# Patient Record
Sex: Female | Born: 1970
Health system: Southern US, Community
[De-identification: ages and names within clinical notes are randomized; demographics above are authoritative.]

## PROBLEM LIST (undated history)

## (undated) DIAGNOSIS — F329 Major depressive disorder, single episode, unspecified: Secondary | ICD-10-CM

## (undated) DIAGNOSIS — R112 Nausea with vomiting, unspecified: Secondary | ICD-10-CM

## (undated) DIAGNOSIS — L408 Other psoriasis: Secondary | ICD-10-CM

## (undated) DIAGNOSIS — H9191 Unspecified hearing loss, right ear: Secondary | ICD-10-CM

## (undated) DIAGNOSIS — G40909 Epilepsy, unspecified, not intractable, without status epilepticus: Secondary | ICD-10-CM

## (undated) DIAGNOSIS — K589 Irritable bowel syndrome without diarrhea: Secondary | ICD-10-CM

## (undated) DIAGNOSIS — F419 Anxiety disorder, unspecified: Secondary | ICD-10-CM

## (undated) DIAGNOSIS — I499 Cardiac arrhythmia, unspecified: Secondary | ICD-10-CM

## (undated) DIAGNOSIS — D126 Benign neoplasm of colon, unspecified: Secondary | ICD-10-CM

## (undated) DIAGNOSIS — I493 Ventricular premature depolarization: Secondary | ICD-10-CM

## (undated) DIAGNOSIS — E039 Hypothyroidism, unspecified: Secondary | ICD-10-CM

## (undated) DIAGNOSIS — K760 Fatty (change of) liver, not elsewhere classified: Secondary | ICD-10-CM

## (undated) DIAGNOSIS — F32A Depression, unspecified: Secondary | ICD-10-CM

## (undated) DIAGNOSIS — Z9889 Other specified postprocedural states: Secondary | ICD-10-CM

## (undated) DIAGNOSIS — K602 Anal fissure, unspecified: Secondary | ICD-10-CM

## (undated) DIAGNOSIS — N83209 Unspecified ovarian cyst, unspecified side: Secondary | ICD-10-CM

## (undated) DIAGNOSIS — E282 Polycystic ovarian syndrome: Secondary | ICD-10-CM

## (undated) DIAGNOSIS — I10 Essential (primary) hypertension: Secondary | ICD-10-CM

## (undated) DIAGNOSIS — I491 Atrial premature depolarization: Secondary | ICD-10-CM

## (undated) HISTORY — DX: Fatty (change of) liver, not elsewhere classified: K76.0

## (undated) HISTORY — DX: Anal fissure, unspecified: K60.2

## (undated) HISTORY — PX: WISDOM TOOTH EXTRACTION: SHX21

## (undated) HISTORY — PX: CYSTOSCOPY: SUR368

## (undated) HISTORY — PX: ABDOMINOPLASTY: SUR9

## (undated) HISTORY — DX: Irritable bowel syndrome, unspecified: K58.9

## (undated) HISTORY — DX: Benign neoplasm of colon, unspecified: D12.6

## (undated) HISTORY — PX: COLONOSCOPY: SHX174

## (undated) HISTORY — DX: Ventricular premature depolarization: I49.3

## (undated) HISTORY — DX: Unspecified hearing loss, right ear: H91.91

## (undated) HISTORY — PX: LIPOSUCTION: SHX10

## (undated) HISTORY — DX: Atrial premature depolarization: I49.1

---

## 1998-02-11 ENCOUNTER — Ambulatory Visit (HOSPITAL_COMMUNITY): Admission: RE | Admit: 1998-02-11 | Discharge: 1998-02-11 | Payer: Self-pay | Admitting: Urology

## 1998-02-17 ENCOUNTER — Ambulatory Visit (HOSPITAL_COMMUNITY): Admission: RE | Admit: 1998-02-17 | Discharge: 1998-02-17 | Payer: Self-pay | Admitting: Urology

## 1998-09-28 ENCOUNTER — Other Ambulatory Visit: Admission: RE | Admit: 1998-09-28 | Discharge: 1998-09-28 | Payer: Self-pay | Admitting: Obstetrics and Gynecology

## 2000-05-06 ENCOUNTER — Other Ambulatory Visit: Admission: RE | Admit: 2000-05-06 | Discharge: 2000-05-06 | Payer: Self-pay | Admitting: Obstetrics and Gynecology

## 2001-08-29 ENCOUNTER — Other Ambulatory Visit: Admission: RE | Admit: 2001-08-29 | Discharge: 2001-08-29 | Payer: Self-pay | Admitting: Obstetrics and Gynecology

## 2002-10-23 ENCOUNTER — Other Ambulatory Visit: Admission: RE | Admit: 2002-10-23 | Discharge: 2002-10-23 | Payer: Self-pay | Admitting: Obstetrics and Gynecology

## 2004-02-25 ENCOUNTER — Other Ambulatory Visit: Admission: RE | Admit: 2004-02-25 | Discharge: 2004-02-25 | Payer: Self-pay | Admitting: Obstetrics and Gynecology

## 2004-08-21 ENCOUNTER — Ambulatory Visit: Payer: Self-pay | Admitting: Internal Medicine

## 2004-09-05 ENCOUNTER — Ambulatory Visit: Payer: Self-pay | Admitting: Internal Medicine

## 2005-04-16 ENCOUNTER — Other Ambulatory Visit: Admission: RE | Admit: 2005-04-16 | Discharge: 2005-04-16 | Payer: Self-pay | Admitting: Obstetrics and Gynecology

## 2005-07-01 ENCOUNTER — Emergency Department (HOSPITAL_COMMUNITY): Admission: AD | Admit: 2005-07-01 | Discharge: 2005-07-01 | Payer: Self-pay | Admitting: Emergency Medicine

## 2005-07-19 ENCOUNTER — Emergency Department (HOSPITAL_COMMUNITY): Admission: EM | Admit: 2005-07-19 | Discharge: 2005-07-19 | Payer: Self-pay | Admitting: Emergency Medicine

## 2005-08-28 ENCOUNTER — Emergency Department (HOSPITAL_COMMUNITY): Admission: EM | Admit: 2005-08-28 | Discharge: 2005-08-28 | Payer: Self-pay | Admitting: Emergency Medicine

## 2005-09-20 ENCOUNTER — Ambulatory Visit (HOSPITAL_COMMUNITY): Admission: RE | Admit: 2005-09-20 | Discharge: 2005-09-20 | Payer: Self-pay | Admitting: Internal Medicine

## 2008-04-02 ENCOUNTER — Ambulatory Visit: Payer: Self-pay | Admitting: Internal Medicine

## 2008-04-02 DIAGNOSIS — I491 Atrial premature depolarization: Secondary | ICD-10-CM

## 2008-04-02 DIAGNOSIS — I493 Ventricular premature depolarization: Secondary | ICD-10-CM

## 2008-04-02 HISTORY — DX: Ventricular premature depolarization: I49.3

## 2008-04-02 HISTORY — DX: Atrial premature depolarization: I49.1

## 2008-04-07 ENCOUNTER — Encounter: Payer: Self-pay | Admitting: Internal Medicine

## 2008-04-07 ENCOUNTER — Ambulatory Visit: Payer: Self-pay

## 2010-01-26 ENCOUNTER — Encounter: Payer: Self-pay | Admitting: Internal Medicine

## 2010-02-14 DIAGNOSIS — G40309 Generalized idiopathic epilepsy and epileptic syndromes, not intractable, without status epilepticus: Secondary | ICD-10-CM | POA: Insufficient documentation

## 2010-02-14 DIAGNOSIS — Q169 Congenital malformation of ear causing impairment of hearing, unspecified: Secondary | ICD-10-CM | POA: Insufficient documentation

## 2010-02-14 DIAGNOSIS — E282 Polycystic ovarian syndrome: Secondary | ICD-10-CM | POA: Insufficient documentation

## 2010-02-14 DIAGNOSIS — R011 Cardiac murmur, unspecified: Secondary | ICD-10-CM | POA: Insufficient documentation

## 2010-02-14 DIAGNOSIS — I491 Atrial premature depolarization: Secondary | ICD-10-CM | POA: Insufficient documentation

## 2010-02-14 DIAGNOSIS — I4949 Other premature depolarization: Secondary | ICD-10-CM | POA: Insufficient documentation

## 2010-02-14 DIAGNOSIS — L408 Other psoriasis: Secondary | ICD-10-CM | POA: Insufficient documentation

## 2010-02-15 ENCOUNTER — Ambulatory Visit: Payer: Self-pay | Admitting: Internal Medicine

## 2010-02-15 DIAGNOSIS — R55 Syncope and collapse: Secondary | ICD-10-CM | POA: Insufficient documentation

## 2010-02-17 ENCOUNTER — Encounter: Payer: Self-pay | Admitting: Internal Medicine

## 2010-02-20 ENCOUNTER — Telehealth: Payer: Self-pay | Admitting: Internal Medicine

## 2010-02-28 ENCOUNTER — Ambulatory Visit: Payer: Self-pay | Admitting: Cardiology

## 2010-02-28 ENCOUNTER — Ambulatory Visit (HOSPITAL_COMMUNITY): Admission: RE | Admit: 2010-02-28 | Discharge: 2010-02-28 | Payer: Self-pay | Admitting: Internal Medicine

## 2010-02-28 ENCOUNTER — Ambulatory Visit: Payer: Self-pay

## 2010-02-28 ENCOUNTER — Encounter: Payer: Self-pay | Admitting: Internal Medicine

## 2010-03-22 ENCOUNTER — Telehealth: Payer: Self-pay | Admitting: Internal Medicine

## 2010-07-25 NOTE — Assessment & Plan Note (Signed)
Summary: f2y/syncope/jml   Visit Type:  Follow-up Primary Provider:                                                                                                                                                                        Westly Pam, MD                   History of Present Illness: Pam Graham is a pleasant 40 yo WF with a h/o symptomatic PACs and PVCs who presents today for EP follow-up after a recent syncopal episode.  She reports that on 01/20/10 that she was standing over her husband evaluating his recently injured arm at 7:30 am.  She then developed nausea followed by diaphoresis and presyncope.  She denies associated chest pain, SOB, or palpitations.  She told her husband that she felt as if she might pass out.  She went to sit on her bed and had loss of consiousness for a brief period of time.  She reports that it took her several minutes to regain composure.  Though she has had a prior seizure, she did not have observed seizure activity with the event. She went to [facility] and had a negative ER evaluation.  She reports occasional nausea with diaphoresis but denies subsequent presyncope or syncope.  She denies exertional cardiac symptoms or other complaints.  Current Medications (verified): 1)  Loestrin 24 Fe 1-20 Mg-Mcg Tabs (Norethin Ace-Eth Estrad-Fe) .... Uad 2)  Multivitamins   Tabs (Multiple Vitamin) .... Once Daily 3)  Cetirizine Hcl 10 Mg Tabs (Cetirizine Hcl) .... Once Daily 4)  Clonazepam 0.5 Mg Tabs (Clonazepam) .... 1/2 Tablet Two Times A Day 5)  Zoloft 50 Mg Tabs (Sertraline Hcl) .... Once Daily  Allergies (verified): 1)  ! * Pcn 2)  ! Glucophage 3)  ! * Ivp Dye  Past History:  Past Medical History:  1. Premature atrial contractions and premature ventricular contractions   2. Polycystic ovarian syndrome.   3. Erythroderma psoriasis.   4. Benign heart murmur.   5. Right ear congenital deafness.   6. Grand mall seizure in March 2007, which was attributed to multiple  factors including discontinuing Xanax, medications, and sleep deprivation.   Family History:  Notable for colon cancer.  The patient's maternal uncle  had an MI at age 87.  Her maternal grandfather had an MI in his 57s.   Social History: [place]  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  She denies tobacco, alcohol, or drug use.      Review of Systems       All systems are reviewed and negative except as listed in the HPI.   Vital Signs:  Patient profile:   40 year old female Height:      68 inches Weight:      170 pounds BMI:     25.94 Pulse rate:   76 / minute Pulse (ortho):   82 / minute BP sitting:   128 / 86  (left arm) BP standing:   118 / 80  Vitals Entered By: Laurance Flatten CMA (February 15, 2010 11:20 AM)  Serial Vital Signs/Assessments:  Time      Position  BP       Pulse  Resp  Temp     By           Lying RA  122/82   67                    Jewel Hardy CMA           Sitting   115/83   69                    Jewel Hardy CMA           Standing  118/     82                    Jewel Hardy CMA           Standing  121/80   76                    Jewel Hardy CMA           Standing  117/81   88                    Jewel Hardy CMA   Physical Exam  General:  Well developed, well nourished, in no acute distress. Head:  normocephalic and atraumatic Eyes:  PERRLA/EOM intact; conjunctiva and lids normal. Mouth:  Teeth, gums and palate normal. Oral mucosa normal. Neck:  Neck supple, no JVD. No masses, thyromegaly or abnormal cervical nodes. Lungs:  Clear bilaterally to auscultation and percussion. Heart:  Non-displaced PMI, chest non-tender; regular rate and rhythm, S1, S2 without murmurs, rubs or gallops. Carotid upstroke normal, no bruit. Normal abdominal aortic size, no bruits. Femorals normal pulses, no bruits. Pedals normal pulses. No edema, no varicosities. Abdomen:  Bowel sounds positive; abdomen soft and non-tender  without masses, organomegaly, or hernias noted. No hepatosplenomegaly. Msk:  Back normal, normal gait. Muscle strength and tone normal. Pulses:  pulses normal in all 4 extremities Extremities:  No clubbing or cyanosis. Neurologic:  Alert and oriented x 3. Skin:  Intact without lesions or rashes. Cervical Nodes:  no significant adenopathy Psych:  Normal affect.   Echocardiogram  Procedure date:  04/07/2008  Findings:       SUMMARY -  Overall left ventricular systolic function was normal. Left       ventricular ejection fraction was estimated to be 60 %. There       were no left ventricular regional wall motion abnormalities.    EKG  Procedure date:  02/15/2010  Findings:      sinus rhythm 75 bpm, PR 110 without obvious pre-excitation, otherwise normal ekg  Impression & Recommendations:  Problem # 1:  SYNCOPE (ICD-780.2) The patient presents for further evaluation of a recent episode of syncope.  Her history is classic for vasovagal syncope.  Though I am not certain why this event occured, she is mildly orthostatic by heart rate and may have been  dehydrated.  I think that an arrhythmia or other cardiac cause is unlikely. I will place event monitor and repeat echo (echo 2 years ago is normal).  If this workup is unrevealing and she has no further syncope, then I would not recommend further testing.  I have recommended adequate hydration. In addition, she is aware of DMV's policy of no driving for 6 months following an episode of syncope.  Problem # 2:  PREMATURE VENTRICULAR CONTRACTIONS (ICD-427.69) The patients PACs and PVCs have significantly improved since last visit no further management is required.  Other Orders: Echocardiogram (Echo) Holter Monitor (Holter Monitor)  Patient Instructions: 1)  Your physician has requested that you have an echocardiogram.  Echocardiography is a painless test that uses sound waves to create images of your heart. It provides your doctor  with information about the size and shape of your heart and how well your heart's chambers and valves are working.  This procedure takes approximately one hour. There are no restrictions for this procedure. 2)  Your physician has recommended that you wear a holter monitor.  Holter monitors are medical devices that record the heart's electrical activity. Doctors most often use these monitors to diagnose arrhythmias. Arrhythmias are problems with the speed or rhythm of the heartbeat. The monitor is a small, portable device. You can wear one while you do your normal daily activities. This is usually used to diagnose what is causing palpitations/syncope (passing out). 3)  No driving for 5 more months 4)  Your physician recommends that you schedule a follow-up appointment in: 3 months with Dr Johney Frame

## 2010-07-25 NOTE — Letter (Signed)
Summary: [facility] Office Note   [facility] Office Note   Imported By: Roderic Ovens 03/14/2010 15:37:14  _____________________________________________________________________  External Attachment:    Type:   Image     Comment:   External Document

## 2010-07-25 NOTE — Procedures (Signed)
Summary: Summary Report  Summary Report   Imported By: Erle Crocker 03/17/2010 11:49:24  _____________________________________________________________________  External Attachment:    Type:   Image     Comment:   External Document

## 2010-07-25 NOTE — Progress Notes (Signed)
Summary: Holter results [facility])  Phone Note Outgoing Call   Call placed by: Sherri Rad, RN, BSN,  February 20, 2010 5:35 PM Call placed to: Patient Summary of Call: I left a message for the pt. to call regarding her holter results. Per Dr. Johney Frame- predominantly NSR. He was looking more for tachycardia, but monitor showed the pt had some slow HR's with rates down to the 40's, avg HR was 70. She had occasional PVC's. Per Dr.  Johney Frame- pt to have next week (9/6) as scheduled. She has f/u in november with Dr. Johney Frame. This will be left the same unless any abnormalities show up on the echo.  Initial call taken by: Sherri Rad, RN, BSN,  February 20, 2010 5:37 PM

## 2010-07-25 NOTE — Procedures (Signed)
Summary: Summary Report  Summary Report   Imported By: Erle Crocker 03/22/2010 14:09:10  _____________________________________________________________________  External Attachment:    Type:   Image     Comment:   External Document

## 2010-07-25 NOTE — Progress Notes (Signed)
Summary: want results of 24hr heart monitor  Phone Note Call from Patient Call back at Home Phone [phone]   Caller: Patient Reason for Call: Talk to Nurse, Talk to Doctor, Lab or Test Results Summary of Call: pt wants results of 24hr holter results Initial call taken by: Omer Jack,  March 22, 2010 9:36 AM  Follow-up for Phone Call        lmom for pt to call back Dennis Bast, RN, BSN  March 22, 2010 10:55 AM lmom for pt to call me back Dennis Bast, RN, BSN  March 24, 2010 8:57 AM lmom for pt to call me back for results Dennis Bast, RN, BSN  March 29, 2010 5:29 PM

## 2010-09-26 ENCOUNTER — Telehealth: Payer: Self-pay | Admitting: Internal Medicine

## 2010-09-26 NOTE — Telephone Encounter (Signed)
Echo,12,LOV [facility] @ [phone] 09/26/10/KM

## 2010-11-07 NOTE — Letter (Signed)
April 02, 2008    Guy Sandifer. Henderson Cloud, M.D.  [address]  [place], [place] [address]   RE:  JENINE, KRISHER  MRN:  [id number]  /  DOB:  [birth year]   Dear Dr. Henderson Cloud:   It was my pleasure to see your patient Ameera Tigue in electrophysiologic  consultation today regarding her premature atrial contractions and  premature ventricular contractions.  As you are aware, this is a very  pleasant 40 year old female with a history of polycystic ovary syndrome  and erythroderma of psoriasis who has recently developed symptomatic  premature ventricular contractions.  She reports that over the past 2  months that she has had sensation of my heart is flipping .  Occasionally, these episodes are accompanied by cough.  She denies  associated dizziness, near syncope or syncope, chest discomfort,  shortness of breath, or other symptoms.  She finds that these episodes  are frequently worsened with the stress.  She is also found that  caffeine occasionally appears to make them worse.  She recently had EKGs  obtained on February 17, 2008, during these episodes and was able to  capture a premature ventricular contraction of a right bundle branch,  right inferior axis morphology.  Rare premature atrial contractions were  also observed on rhythm strip.  Upon discussion with you, her Yasmin was  discontinued.  Despite this medication change, she has had ongoing  symptomatic premature ventricular contractions.  She reports several  palpitations approximately 2 years ago, which lasted for about a month  and then terminated on her own.  She denies any prior syncopal episodes.  She is otherwise without complaint today.   PAST MEDICAL HISTORY:  1. Premature atrial contractions and premature ventricular      contractions (as above).  2. Polycystic ovarian syndrome.  3. Erythroderma psoriasis.  4. Benign heart murmur.  5. Right ear congenital deafness.  6. Grand mall seizure in March 2007, which was  attributed to multiple      factors including discontinuing Xanax, medications, and sleep      deprivation.   CURRENT MEDICATIONS:  1. Claritin 10 mg daily.  2. Loestrin plus iron 1 mg/20 mcg daily.   ALLERGIES:  PENICILLIN and IVP DYE cause rash, GLUCOPHAGE causes  itching.   SOCIAL HISTORY:  [place] in [place], [place].  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  She denies tobacco, alcohol, or drug use.   FAMILY HISTORY:  Notable for colon cancer.  The patient's maternal uncle  had an MI at age 2.  Her maternal grandfather had an MI in his 41s.   REVIEW OF SYSTEMS:  All systems are reviewed and negative except as  outlined in the HPI above.   PHYSICAL EXAMINATION:  VITAL SIGNS:  Blood pressure 160/90 (repeated by  me to be 128/96), heart rate 90, respirations 18, and weight 182 pounds.  GENERAL:  The patient is a well-appearing female in no acute distress.  She is alert and oriented x3.  HEENT:  Normocephalic and atraumatic.  Sclerae clear.  Conjunctivae  pink.  Oropharynx clear.  NECK:  Supple.  No JVD, lymphadenopathy, or bruits.  LUNGS:  Clear to auscultation bilaterally.  HEART:  Regular rate and rhythm.  No murmurs, rubs, or gallops.  GI:  Soft, nontender, nondistended.  Positive bowel sounds.  EXTREMITIES:  No clubbing, cyanosis, or edema.  NEUROLOGIC:  Strength and sensation are intact.  SKIN:  No ecchymosis or lacerations.  MUSCULOSKELETAL:  No deformity or atrophy.  PSYCHIATRIC:  Euthymic mood, full affect.   EKG:  The patient's EKG from August 25 reveals normal sinus rhythm with  a single premature ventricular contraction as described above.  The EKG  is otherwise normal   TSH from your office February 27, 2008, is normal.  Electrolytes from  that same time were also normal.   IMPRESSION:  Ms. Judithann Sheen is a very pleasant 40 year old female who  presents with symptomatic premature atrial  contractions and premature  ventricular contractions.  I suspect that these are completely benign.  The patient reports that they have been worsened by stress and possibly  and possibly caffeine use.  I have therefore recommended that she work  on lifestyle modification with a limitation of stress if possible and  also a limitation and also limiting her caffeine.  The patient's blood  pressure was initially elevated upon arrival to our clinic today.  Certainly hypertension cannot only increase the frequency of premature  ventricular contractions, but can also make person more symptomatic when  they occur.   PLAN:  Therapeutic strategies for PACs and PVCs were discussed in detail  with the patient today.  Lifestyle modification and medical therapy with  beta blockers were discussed.  Presently, I think the patient is not  symptomatic enough that we should initiate her on beta blocker therapy  at this time.  I think that she should work on salt restrictions and  regular exercise and weight loss and reduction of stress.  I have asked  her to check her blood pressure regularly and to follow up with your  office should her blood pressure be frequently elevated.  She would  likely benefit from treatment of hypertension if lifestyle modification  alone does not improve her blood pressure.  We will also obtain  transthoracic echocardiogram today to evaluate for any structural heart  disease as an underlying cause for her premature ventricular  contractions.  However, should her echocardiogram being normal, then I  think no further cardiac evaluation would be necessary at this time.   Thank you for the opportunity to participate in the care of [facility].  Please let me know if I can be of assistance in the future.    Sincerely,      Hillis Range, MD  Electronically Signed    JA/MedQ  DD: 04/02/2008  DT: 04/02/2008  Job #: [id number]   CC:    Rollene Rotunda, MD, [facility]

## 2011-09-17 ENCOUNTER — Encounter (HOSPITAL_COMMUNITY): Payer: Self-pay | Admitting: *Deleted

## 2011-09-17 ENCOUNTER — Inpatient Hospital Stay (HOSPITAL_COMMUNITY)
Admission: AD | Admit: 2011-09-17 | Discharge: 2011-09-17 | Disposition: A | Discharge status: Home / Self Care | Payer: BC Managed Care – PPO | Source: Ambulatory Visit | Attending: Obstetrics and Gynecology | Admitting: Obstetrics and Gynecology

## 2011-09-17 DIAGNOSIS — R1115 Cyclical vomiting syndrome unrelated to migraine: Secondary | ICD-10-CM

## 2011-09-17 DIAGNOSIS — R1032 Left lower quadrant pain: Secondary | ICD-10-CM | POA: Insufficient documentation

## 2011-09-17 DIAGNOSIS — R1110 Vomiting, unspecified: Secondary | ICD-10-CM

## 2011-09-17 DIAGNOSIS — R109 Unspecified abdominal pain: Secondary | ICD-10-CM

## 2011-09-17 DIAGNOSIS — R112 Nausea with vomiting, unspecified: Secondary | ICD-10-CM | POA: Insufficient documentation

## 2011-09-17 DIAGNOSIS — N83202 Unspecified ovarian cyst, left side: Secondary | ICD-10-CM

## 2011-09-17 DIAGNOSIS — N83209 Unspecified ovarian cyst, unspecified side: Secondary | ICD-10-CM | POA: Insufficient documentation

## 2011-09-17 HISTORY — DX: Essential (primary) hypertension: I10

## 2011-09-17 HISTORY — DX: Epilepsy, unspecified, not intractable, without status epilepticus: G40.909

## 2011-09-17 HISTORY — DX: Unspecified ovarian cyst, unspecified side: N83.209

## 2011-09-17 HISTORY — DX: Other psoriasis: L40.8

## 2011-09-17 HISTORY — DX: Polycystic ovarian syndrome: E28.2

## 2011-09-17 LAB — CBC
MCH: 31.3 pg (ref 26.0–34.0)
MCHC: 33.9 g/dL (ref 30.0–36.0)
MCV: 92.2 fL (ref 78.0–100.0)
Platelets: 231 10*3/uL (ref 150–400)
RDW: 12.8 % (ref 11.5–15.5)

## 2011-09-17 LAB — COMPREHENSIVE METABOLIC PANEL
ALT: 28 U/L (ref 0–35)
AST: 25 U/L (ref 0–37)
Alkaline Phosphatase: 96 U/L (ref 39–117)
CO2: 22 mEq/L (ref 19–32)
GFR calc Af Amer: 81 mL/min — ABNORMAL LOW (ref >90)
Glucose, Bld: 194 mg/dL — ABNORMAL HIGH (ref 70–99)
Sodium: 133 mEq/L — ABNORMAL LOW (ref 135–145)

## 2011-09-17 MED ORDER — ONDANSETRON HCL 4 MG/2ML IJ SOLN
4.0000 mg | Freq: Once | INTRAMUSCULAR | Status: AC
  Administered 2011-09-17: 4 mg via INTRAVENOUS
  Filled 2011-09-17: qty 2

## 2011-09-17 MED ORDER — DEXTROSE 5 % IN LACTATED RINGERS IV BOLUS
1000.0000 mL | Freq: Once | INTRAVENOUS | Status: AC
  Administered 2011-09-17: 1000 mL via INTRAVENOUS

## 2011-09-17 MED ORDER — ONDANSETRON 8 MG PO TBDP: 8.0000 mg | ORAL_TABLET | Freq: Three times a day (TID) | ORAL | Status: AC | PRN

## 2011-09-17 MED ORDER — KETOROLAC TROMETHAMINE 30 MG/ML IJ SOLN
30.0000 mg | Freq: Once | INTRAMUSCULAR | Status: AC
  Administered 2011-09-17: 30 mg via INTRAVENOUS
  Filled 2011-09-17: qty 1

## 2011-09-17 NOTE — MAU Provider Note (Signed)
History     CSN: [id number]  Arrival date and time: 09/17/11 1531   First Provider Initiated Contact with Patient 09/17/11 1624      Chief Complaint  Patient presents with  . Abdominal Pain   HPI  Pt is not pregnant and was seen by Dr. Henderson Cloud in the office today with LLQ pain associated with hyperemesis.  Pt had an ultrasound in the office that showed a 2.2cm simple cyst and no FF.  Pt is here for analgesia and antiemetics with hydration.  CBC and CMET drawn.  She took 2 hydrocodone this morning aroun 11 am and did not help the pain.  Pt has PCO and has irregular periods.  She is on LoEstrin 24.  Past Medical History  Diagnosis Date  . Ovarian cyst   . PCOS (polycystic ovarian syndrome)   . Erythrodermic psoriasis   . Seizure disorder   . Hypertension     Past Surgical History  Procedure Date  . Cystoscopy   . Liposuction   . Abdominoplasty   . Colonoscopy     Family History  Problem Relation Age of Onset  . Anesthesia problems Neg Hx     History  Substance Use Topics  . Smoking status: Never Smoker   . Smokeless tobacco: Not on file  . Alcohol Use: Yes     socially    Allergies:  Allergies  Allergen Reactions  . Iohexol      Desc: HIVES,ICHINGING,NEEDS PRE.MEDS   . Metformin Itching  . Penicillins Hives and Itching    Prescriptions prior to admission  Medication Sig Dispense Refill  . cetirizine (ZYRTEC) 10 MG tablet Take 10 mg by mouth daily.      Marland Kitchen levETIRAcetam (KEPPRA XR) 500 MG 24 hr tablet Take 1,000 mg by mouth at bedtime.      . Multiple Vitamin (MULITIVITAMIN WITH MINERALS) TABS Take 1 tablet by mouth daily.      . norethindrone-ethinyl estradiol-iron (MICROGESTIN FE,GILDESS FE,LOESTRIN FE) 1.5-30 MG-MCG tablet Take 1 tablet by mouth at bedtime.      . sertraline (ZOLOFT) 100 MG tablet Take 100 mg by mouth daily.        ROS Physical Exam   Blood pressure 139/84, pulse 56, temperature 96.7 F (35.9 C), temperature source Oral, resp.  rate 16, height 5\' 8"  (1.727 m), weight 190 lb 6 oz (86.354 kg).  Physical Exam  Vitals reviewed. Constitutional: She is oriented to person, place, and time. She appears well-developed and well-nourished.       Pale and uncomfortable appearing  HENT:  Head: Normocephalic.  Eyes: Pupils are equal, round, and reactive to light.  Neck: Normal range of motion. Neck supple.  Cardiovascular: Normal rate.   Respiratory: Effort normal.  GI: Soft.  Musculoskeletal: Normal range of motion.  Neurological: She is alert and oriented to person, place, and time.  Skin: Skin is warm and dry. There is pallor.  Psychiatric: She has a normal mood and affect.    MAU Course  Procedures Results for orders placed during the hospital encounter of 09/17/11 (from the past 24 hour(s))  CBC     Status: Abnormal   Collection Time   09/17/11  4:15 PM      Component Value Range   WBC 14.4 (*) 4.0 - 10.5 (K/uL)   RBC 4.51  3.87 - 5.11 (MIL/uL)   Hemoglobin 14.1  12.0 - 15.0 (g/dL)   HCT 40.9  81.1 - 91.4 (%)   MCV 92.2  78.0 -  100.0 (fL)   MCH 31.3  26.0 - 34.0 (pg)   MCHC 33.9  30.0 - 36.0 (g/dL)   RDW 69.6  29.5 - 28.4 (%)   Platelets 231  150 - 400 (K/uL)  COMPREHENSIVE METABOLIC PANEL     Status: Abnormal   Collection Time   09/17/11  4:15 PM      Component Value Range   Sodium 133 (*) 135 - 145 (mEq/L)   Potassium 4.2  3.5 - 5.1 (mEq/L)   Chloride 97  96 - 112 (mEq/L)   CO2 22  19 - 32 (mEq/L)   Glucose, Bld 194 (*) 70 - 99 (mg/dL)   BUN 13  6 - 23 (mg/dL)   Creatinine, Ser 1.32  0.50 - 1.10 (mg/dL)   Calcium 9.6  8.4 - 44.0 (mg/dL)   Total Protein 7.0  6.0 - 8.3 (g/dL)   Albumin 3.7  3.5 - 5.2 (g/dL)   AST 25  0 - 37 (U/L)   ALT 28  0 - 35 (U/L)   Alkaline Phosphatase 96  39 - 117 (U/L)   Total Bilirubin 0.4  0.3 - 1.2 (mg/dL)   GFR calc non Af Amer 70 (*) >90 (mL/min)   GFR calc Af Amer 81 (*) >90 (mL/min)  pt's pain and nausea relieved with IV hydration with LR and Zofran IV and Toradol  IV  Assessment and Plan  Ovarian cyst- continue BCP Elevated serum glucose PCO- pt will f/u with endocrinologist at [facility] Abdominal pain with nausea and vomiting- prescription for Zofran given and pt has prescription for Percocet and pt will alternate with Ibuprofen Pt to f/u with Dr. Henderson Cloud if pain persists or worsens Pam Graham 09/17/2011, 5:09 PM

## 2011-09-17 NOTE — MAU Note (Signed)
Pt states was sent from Dr. Huel Coventry office for iv fluids, bloodwork, has llq pain, denies vag d/c changes or bleeding. UPT negative per Dr. Henderson Cloud.

## 2011-09-17 NOTE — MAU Note (Signed)
Pt sent over from office for pain medication and iv fluids.  Had an ultrasound in office that showed a cyst on left ovary.  States pain started Wednesday, but got bad today.

## 2011-12-19 ENCOUNTER — Other Ambulatory Visit (HOSPITAL_COMMUNITY): Payer: Self-pay | Admitting: Physician Assistant

## 2011-12-19 ENCOUNTER — Ambulatory Visit (HOSPITAL_COMMUNITY)
Admission: RE | Admit: 2011-12-19 | Discharge: 2011-12-19 | Disposition: A | Discharge status: Home / Self Care | Payer: BC Managed Care – PPO | Source: Ambulatory Visit | Attending: Physician Assistant | Admitting: Physician Assistant

## 2011-12-19 DIAGNOSIS — R220 Localized swelling, mass and lump, head: Secondary | ICD-10-CM | POA: Insufficient documentation

## 2011-12-19 DIAGNOSIS — L049 Acute lymphadenitis, unspecified: Secondary | ICD-10-CM

## 2011-12-19 DIAGNOSIS — R221 Localized swelling, mass and lump, neck: Secondary | ICD-10-CM | POA: Insufficient documentation

## 2011-12-20 ENCOUNTER — Other Ambulatory Visit (HOSPITAL_COMMUNITY): Payer: Self-pay | Admitting: Physician Assistant

## 2011-12-20 DIAGNOSIS — R220 Localized swelling, mass and lump, head: Secondary | ICD-10-CM

## 2011-12-21 ENCOUNTER — Other Ambulatory Visit (HOSPITAL_COMMUNITY): Payer: Self-pay | Admitting: Physician Assistant

## 2011-12-21 DIAGNOSIS — R945 Abnormal results of liver function studies: Secondary | ICD-10-CM

## 2011-12-24 ENCOUNTER — Other Ambulatory Visit (HOSPITAL_COMMUNITY): Payer: BC Managed Care – PPO

## 2011-12-24 ENCOUNTER — Ambulatory Visit (HOSPITAL_COMMUNITY)
Admission: RE | Admit: 2011-12-24 | Discharge: 2011-12-24 | Disposition: A | Discharge status: Home / Self Care | Payer: BC Managed Care – PPO | Source: Ambulatory Visit | Attending: Physician Assistant | Admitting: Physician Assistant

## 2011-12-24 DIAGNOSIS — R221 Localized swelling, mass and lump, neck: Secondary | ICD-10-CM

## 2011-12-24 DIAGNOSIS — R220 Localized swelling, mass and lump, head: Secondary | ICD-10-CM | POA: Insufficient documentation

## 2011-12-24 DIAGNOSIS — R599 Enlarged lymph nodes, unspecified: Secondary | ICD-10-CM | POA: Insufficient documentation

## 2011-12-24 MED ORDER — GADOBENATE DIMEGLUMINE 529 MG/ML IV SOLN
18.0000 mL | Freq: Once | INTRAVENOUS | Status: AC | PRN
  Administered 2011-12-24: 18 mL via INTRAVENOUS

## 2011-12-26 ENCOUNTER — Ambulatory Visit (HOSPITAL_COMMUNITY)
Admission: RE | Admit: 2011-12-26 | Discharge: 2011-12-26 | Disposition: A | Discharge status: Home / Self Care | Payer: BC Managed Care – PPO | Source: Ambulatory Visit | Attending: Physician Assistant | Admitting: Physician Assistant

## 2011-12-26 ENCOUNTER — Other Ambulatory Visit (HOSPITAL_COMMUNITY): Payer: BC Managed Care – PPO

## 2011-12-26 DIAGNOSIS — K838 Other specified diseases of biliary tract: Secondary | ICD-10-CM | POA: Insufficient documentation

## 2011-12-26 DIAGNOSIS — K760 Fatty (change of) liver, not elsewhere classified: Secondary | ICD-10-CM

## 2011-12-26 DIAGNOSIS — R945 Abnormal results of liver function studies: Secondary | ICD-10-CM

## 2011-12-26 HISTORY — DX: Fatty (change of) liver, not elsewhere classified: K76.0

## 2012-10-28 ENCOUNTER — Encounter: Payer: Self-pay | Admitting: *Deleted

## 2012-12-16 ENCOUNTER — Encounter: Payer: Self-pay | Admitting: Internal Medicine

## 2012-12-16 ENCOUNTER — Ambulatory Visit (INDEPENDENT_AMBULATORY_CARE_PROVIDER_SITE_OTHER): Payer: BC Managed Care – PPO | Admitting: Internal Medicine

## 2012-12-16 VITALS — BP 110/80 | HR 72 | Ht 68.0 in | Wt 197.5 lb

## 2012-12-16 DIAGNOSIS — K625 Hemorrhage of anus and rectum: Secondary | ICD-10-CM

## 2012-12-16 MED ORDER — HYDROCORTISONE ACETATE 25 MG RE SUPP: 25.0000 mg | Freq: Every day | RECTAL | Status: DC

## 2012-12-16 NOTE — Patient Instructions (Addendum)
We have sent the following medications to your pharmacy for you to pick up at your convenience: [facility]   Today you have been given a handout on fiber to read and follow. Ben Mann,PA

## 2012-12-16 NOTE — Progress Notes (Signed)
Pam Graham 1970/09/14 MRN [id number]  History of Present Illness:  This is a 42 year old white female with painless low-volume hematochezia occurring intermittently. She is also complaining of incomplete cleaning after having a bowel movement. There is no leakage. Her bowel habits are regular, having 1-2 bowel movements a day. She was evaluated for hematochezia in 2006 with a colonoscopy and had no abnormalities. There is a family history of colon cancer in a paternal grandmother and colon polyps in her father and father's sister. There is a history of anal fissure which was treated in the past.   Past Medical History  Diagnosis Date  . Ovarian cyst   . PCOS (polycystic ovarian syndrome)   . Erythrodermic psoriasis   . Seizure disorder   . Hypertension   . Fatty liver 12/26/11  . PAC (premature atrial contraction) 04/02/08  . PVC (premature ventricular contraction) 04/02/08  . Deafness in right ear     congenital  . Anal fissure    Past Surgical History  Procedure Laterality Date  . Cystoscopy    . Liposuction    . Abdominoplasty    . Colonoscopy      reports that she has never smoked. She has never used smokeless tobacco. She reports that  drinks alcohol. She reports that she does not use illicit drugs. family history includes Alcoholism in her maternal grandfather; Colon cancer in her paternal grandmother; Colon polyps in her father and paternal aunt; Diabetes in some unspecified family members; and Heart disease in her maternal grandfather and unspecified family member.  There is no history of Anesthesia problems. Allergies  Allergen Reactions  . Iohexol      Desc: HIVES,ICHINGING,NEEDS PRE.MEDS   . Penicillins Hives and Itching        Review of Systems: Denies abdominal pain diarrhea  The remainder of the 10 point ROS is negative except as outlined in H&P   Physical Exam: General appearance  Well developed, in no distress. Eyes- non icteric. HEENT nontraumatic,  normocephalic. Mouth no lesions, tongue papillated, no cheilosis. Neck supple without adenopathy, thyroid not enlarged, no carotid bruits, no JVD. Lungs Clear to auscultation bilaterally. Cor normal S1, normal S2, regular rhythm, no murmur,  quiet precordium. Abdomen: Soft nontender abdomen with normoactive bowel sounds. No palpable mass. Rectal: And anoscopic exam reveals normal perianal area. Increased rectal sphincter tone. No evidence of hemorrhoids. No bleeding or fissure. Stool is Hemoccult negative. Extremities no pedal edema. Skin no lesions. Neurological alert and oriented x 3. Psychological normal mood and affect.  Assessment and Plan:  Problem #1 Patient with low-volume painless rectal bleeding suggestive of a normal rectal source. I could not demonstrate any abnormality on today's exam except that she has an increased rectal sphincter tone which would be condusive to anal fissures. We will start her on Benefiber 1 heaping teaspoon daily and Anusol-HC suppositories. She may evacuate better if  if she takes more fiber. If the bleeding continues, we will  Proceed with colonoscopy.   12/16/2012 Pam Graham

## 2013-09-15 ENCOUNTER — Ambulatory Visit: Payer: BC Managed Care – PPO | Admitting: Internal Medicine

## 2013-10-08 ENCOUNTER — Telehealth: Payer: Self-pay | Admitting: Internal Medicine

## 2013-10-08 NOTE — Telephone Encounter (Signed)
I am not sure what to do for that. I checked her  On 11/2012- increased anal sphincter, she may use some relaxation and time to spend in the bathroom when having a B.M, May use Glycerine supp prn emptying the stool from the rectum. If there is bleeding then she may need colonoscopy ( last exam 2006)

## 2013-10-08 NOTE — Telephone Encounter (Signed)
Patient calling to report after she has a stool, she has to return to the bathroom and keep wiping stool from her rectum. Stool is soft. She reports she saw Dr. Olevia Perches for this last year. She has tried taking the Benefiber 1 teaspoon daily and it is not helping. Denies bleeding. Please, advise.

## 2013-10-09 NOTE — Telephone Encounter (Signed)
Spoke with patient and gave her recommendations. She will call back if needed.

## 2014-04-01 ENCOUNTER — Telehealth: Payer: Self-pay | Admitting: Internal Medicine

## 2014-04-01 NOTE — Telephone Encounter (Signed)
Spoke with patient and she is still having same problems she had last year. States she has a bowel movement and then 30 minutes later goes to bathroom and wipes and there is stool on the tissue. She states when the urge to have a bowel movement comes, she has to go to bathroom quickly.She states she has not been taking the Benefiber that was recommended. She does not remember if it helped before or not. She agrees to try it every day for 2 weeks then call back with update. She states the Glycerin suppositories did not help her.

## 2014-04-01 NOTE — Telephone Encounter (Signed)
I agree, get back on Benefiber every day , one tablespoon. I suspect she has a rectocele.

## 2014-09-08 ENCOUNTER — Telehealth: Payer: Self-pay | Admitting: Internal Medicine

## 2014-09-08 NOTE — Telephone Encounter (Signed)
Spoke with patient and she had been doing well until a couple of days ago. She had been taking 2 teaspoons of Benefiber/day. She will try taking one tablespoon/day like Dr. Olevia Perches recommended on last telephone note.

## 2014-09-27 ENCOUNTER — Telehealth: Payer: Self-pay | Admitting: Internal Medicine

## 2014-09-27 NOTE — Telephone Encounter (Signed)
I think she is having symptoms of rectocele, goes back to 2006. She needs to check with her Gynecologist if she has pelvic relaxation . In the meantime  Try  Probiotic 1 po qd.

## 2014-09-27 NOTE — Telephone Encounter (Signed)
Patient states she has taken Benefiber 1 tablespoon for 2 weeks. (March 19-31) She had a bowel movement every day except the 31st. She states she was wiping herself all the time in between stools for stool material 7 out of 14 days. She is not sure the Benefiber helped. One day, she wiped an almond size amount of stool on tissue. [place] is going [place] of [place] 2 [place] and is concerned about the stool leakage. She scheduled an OV to see MD on 11/05/14 at 1 PM. She is asking if Dr. Thereasa Solo has any suggestions to try until OV.

## 2014-09-28 NOTE — Telephone Encounter (Signed)
Left a message for patient to call back. 

## 2014-09-28 NOTE — Telephone Encounter (Signed)
Patient given recommendations. 

## 2014-10-01 ENCOUNTER — Telehealth: Payer: Self-pay | Admitting: Internal Medicine

## 2014-10-01 NOTE — Telephone Encounter (Signed)
Spoke with patient and she will do both of these.

## 2014-11-04 ENCOUNTER — Ambulatory Visit: Payer: Self-pay | Admitting: Internal Medicine

## 2014-11-05 ENCOUNTER — Ambulatory Visit: Payer: Self-pay | Admitting: Internal Medicine

## 2014-12-24 ENCOUNTER — Ambulatory Visit (INDEPENDENT_AMBULATORY_CARE_PROVIDER_SITE_OTHER): Payer: PRIVATE HEALTH INSURANCE | Admitting: Internal Medicine

## 2014-12-24 ENCOUNTER — Encounter: Payer: Self-pay | Admitting: Internal Medicine

## 2014-12-24 VITALS — BP 122/88 | HR 80 | Ht 68.0 in | Wt 203.0 lb

## 2014-12-24 DIAGNOSIS — R159 Full incontinence of feces: Secondary | ICD-10-CM | POA: Diagnosis not present

## 2014-12-24 MED ORDER — HYDROCORTISONE ACETATE 25 MG RE SUPP: 25.0000 mg | Freq: Every day | RECTAL | Status: DC

## 2014-12-24 MED ORDER — DICYCLOMINE HCL 10 MG PO CAPS: 10.0000 mg | ORAL_CAPSULE | Freq: Two times a day (BID) | ORAL | Status: DC

## 2014-12-24 NOTE — Patient Instructions (Signed)
We will send in your prescriptions to your pharmacy Discontinue probiotic Your follow up is scheduled on 01/28/2015 at 1:45pm

## 2014-12-24 NOTE — Progress Notes (Signed)
Pam Graham September 06, 1970 [id number]  Note: This dictation was prepared with Dragon digital system. Any transcriptional errors that result from this procedure are unintentional.   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 Presents with ongoing symptoms of rectal irritation and small amount of stool drainage  which occurs on daily basis. Her  bowel habits are regular having 2-3 soft stools a day  and she denies abdominal pain. She had a normal colonoscopy in 2006. She gives history of  colon cancer in paternal grandmother and colon polyps in her  father and his sister. She has a history of anal fissure which was evaluated on last office visit in June 2014. She was treated for irritable bowel syndrome with Benefiber and Anusol HC suppositories. She did well till  few months ago when she started having leakage of the stool. She started probiotics in the April with some improvement . She has a sedentary job sitting at a computer every day. She has had recent gynecological evaluation which was a normal, no evidence of rectocele or cystocele. She has been on metformin 500 mg 3 times a day for polycystic ovary    Past Medical History  Diagnosis Date  . Ovarian cyst   . PCOS (polycystic ovarian syndrome)   . Erythrodermic psoriasis   . Seizure disorder   . Hypertension   . Fatty liver 12/26/11  . PAC (premature atrial contraction) 04/02/08  . PVC (premature ventricular contraction) 04/02/08  . Deafness in right ear     congenital  . Anal fissure     Past Surgical History  Procedure Laterality Date  . Cystoscopy    . Liposuction    . Abdominoplasty    . Colonoscopy      Allergies  Allergen Reactions  . Iohexol      Desc: HIVES,ICHINGING,NEEDS PRE.MEDS   . Penicillins Hives and Itching  . Clindamycin Rash    Family history and social history have been reviewed.  Review of Systems:   The remainder of the 10 point ROS is negative except as outlined in the  H&P  Physical Exam: General Appearance Well developed, in no distress Eyes  Non icteric  HEENT  Non traumatic, normocephalic  Mouth No lesion, tongue papillated, no cheilosis Neck Supple without adenopathy, thyroid not enlarged, no carotid bruits, no JVD Lungs Clear to auscultation bilaterally COR Normal S1, normal S2, regular rhythm, no murmur, quiet precordium Abdomen soft nontender abdomen with normoactive bowel sounds Rectal and anoscopic exam reveals normal perianal area. Normal rectal sphincter tone. 3 small internal hemorrhoids 5-6 mm each. No stigmata of bleeding. No evidence of fissure or proctitis. Stool is Hemoccult-negative Extremities  No pedal edema Skin No lesions Neurological Alert and oriented x 3 Psychological Normal mood and affect  Assessment and Plan:   44 year old white female with rectal symptoms of leakage of small amount of stool in presence of normal rectal sphincter tone. Her bowel habits are regular. Metformin may be causing diarrhea. Irritable bowel syndrome also may be contributing. Her lack of exercise may be affecting adversely her pelvic floor muscles. She will use Anusol HC suppositories for the hemorrhoids and Bentyl 10 mg twice a day to decrease the stool frequency. We may consider holding metformin temporarily to see whether it would alter her bowel habits. She uses water to clean the rectum. If the symptoms continue consider colonoscopy    [name] 12/24/2014

## 2015-01-28 ENCOUNTER — Ambulatory Visit (INDEPENDENT_AMBULATORY_CARE_PROVIDER_SITE_OTHER): Payer: PRIVATE HEALTH INSURANCE | Admitting: Internal Medicine

## 2015-01-28 ENCOUNTER — Encounter: Payer: Self-pay | Admitting: Internal Medicine

## 2015-01-28 VITALS — BP 142/90 | HR 82 | Ht 68.0 in | Wt 208.0 lb

## 2015-01-28 DIAGNOSIS — K589 Irritable bowel syndrome without diarrhea: Secondary | ICD-10-CM | POA: Diagnosis not present

## 2015-01-28 MED ORDER — DICYCLOMINE HCL 20 MG PO TABS
20.0000 mg | ORAL_TABLET | Freq: Two times a day (BID) | ORAL | Status: DC
Start: 2015-01-28 — End: 2015-05-31

## 2015-01-28 MED ORDER — HYDROCORTISONE ACETATE 25 MG RE SUPP: 25.0000 mg | Freq: Every day | RECTAL | Status: DC

## 2015-01-28 NOTE — Patient Instructions (Addendum)
We have sent the following medications to your pharmacy for you to pick up at your convenience:  Bentyl, Hydrocortisone suppositories Rowan Blase MD

## 2015-01-28 NOTE — Progress Notes (Signed)
Pam Graham 11/18/1970 [id number]  Note: This dictation was prepared with Dragon digital system. Any transcriptional errors that result from this procedure are unintentional.   History of Present Illness: This is a 44 year old white female with irritable bowel syndrome, last appointment 12/24/2014 for  rectal leakage and change in bowel habits. She has responded to Bentyl 10 mg twice a day but still has some crampy abdominal pain. She is taking metformin for polycystic ovary. There is a family history of colon cancer in paternal grandmother and  colon polyps in her father and sister. Last colonoscopy in 2006 was normal. There is a history of anal fissure. We have put her on Anusol HC suppositories  which have completely resolved  the  rectal leakage and irritation    Past Medical History  Diagnosis Date  . Ovarian cyst   . PCOS (polycystic ovarian syndrome)   . Erythrodermic psoriasis   . Seizure disorder   . Hypertension   . Fatty liver 12/26/11  . PAC (premature atrial contraction) 04/02/08  . PVC (premature ventricular contraction) 04/02/08  . Deafness in right ear     congenital  . Anal fissure     Past Surgical History  Procedure Laterality Date  . Cystoscopy    . Liposuction    . Abdominoplasty    . Colonoscopy      Allergies  Allergen Reactions  . Iohexol      Desc: HIVES,ICHINGING,NEEDS PRE.MEDS   . Penicillins Hives and Itching  . Clindamycin Rash    Family history and social history have been reviewed.  Review of Systems: Occasional crampy abdominal pain and frequent stools. Denies rectal bleeding  The remainder of the 10 point ROS is negative except as outlined in the H&P  Physical Exam: General Appearance Well developed, in no distress Eyes  Non icteric  HEENT  Non traumatic, normocephalic  Mouth No lesion, tongue papillated, no cheilosis Neck Supple without adenopathy, thyroid not enlarged, no carotid bruits, no JVD Lungs Clear to auscultation  bilaterally COR Normal S1, normal S2, regular rhythm, no murmur, quiet precordium Abdomen soft nontender with normoactive bowel sounds. No distention Rectal and anoscopic exam reveals normal perianal area. Normal rectal sphincter tone. Mild edematous anal canal but no definite hemorrhoids. Stool is Hemoccult-negative. Extremities  No pedal edema Skin No lesions Neurological Alert and oriented x 3 Psychological Normal mood and affect  Assessment and Plan:   44 year old white female with irritable bowel syndrome. We will increase Bentyl to 20 mg twice a day. Continue high-fiber diet. Refill Anusol HC suppositories. Return on when necessary basis    [name] 01/28/2015

## 2015-02-14 ENCOUNTER — Telehealth: Payer: Self-pay | Admitting: Internal Medicine

## 2015-02-14 MED ORDER — HYDROCORTISONE ACETATE 25 MG RE SUPP: 25.0000 mg | Freq: Every day | RECTAL | Status: DC

## 2015-02-14 NOTE — Telephone Encounter (Signed)
Left a message for patient to call back. 

## 2015-02-14 NOTE — Telephone Encounter (Signed)
I agree she needs to  Continue to use Anusol HC supp hs, may need refillsx2

## 2015-02-14 NOTE — Telephone Encounter (Signed)
Left a message for patient with recommendation. Refills sent.(patient requested a VM be left)

## 2015-02-14 NOTE — Addendum Note (Signed)
Addended by: Hulan Saas on: 02/14/2015 02:33 PM   Modules accepted: Orders

## 2015-02-14 NOTE — Telephone Encounter (Signed)
Spoke with patient and she is reporting bright, red blood x 3 days. Has not been using suppositories but she will start using them again. Also reports, gas when urinating and when she wipes she has blood tinged mucous. Feels pressure in rectum.

## 2015-03-31 ENCOUNTER — Ambulatory Visit: Payer: PRIVATE HEALTH INSURANCE | Admitting: Gastroenterology

## 2015-05-30 ENCOUNTER — Ambulatory Visit: Payer: PRIVATE HEALTH INSURANCE | Admitting: Gastroenterology

## 2015-05-31 ENCOUNTER — Encounter: Payer: Self-pay | Admitting: Physician Assistant

## 2015-05-31 ENCOUNTER — Ambulatory Visit (INDEPENDENT_AMBULATORY_CARE_PROVIDER_SITE_OTHER): Payer: 59 | Admitting: Physician Assistant

## 2015-05-31 VITALS — BP 125/64 | HR 70 | Ht 68.0 in | Wt 190.0 lb

## 2015-05-31 DIAGNOSIS — K648 Other hemorrhoids: Secondary | ICD-10-CM | POA: Diagnosis not present

## 2015-05-31 DIAGNOSIS — K625 Hemorrhage of anus and rectum: Secondary | ICD-10-CM | POA: Diagnosis not present

## 2015-05-31 DIAGNOSIS — Z800 Family history of malignant neoplasm of digestive organs: Secondary | ICD-10-CM

## 2015-05-31 MED ORDER — DICYCLOMINE HCL 20 MG PO TABS: 20.0000 mg | ORAL_TABLET | Freq: Two times a day (BID) | ORAL | Status: DC

## 2015-05-31 MED ORDER — HYDROCORTISONE ACETATE 25 MG RE SUPP: 25.0000 mg | Freq: Two times a day (BID) | RECTAL | Status: DC

## 2015-05-31 NOTE — Progress Notes (Addendum)
Patient ID: Pam Graham, female   DOB: June 26, 1970, 44 y.o.   MRN: [id number]     History of Present Illness: Pam Graham is a 44 year old female who was previously followed by Dr. Olevia Perches. He has a history of psoriasis, hypertension, fatty liver, seizure disorder, polycystic ovarian syndrome, PACs, PVCs, irritable bowel syndrome, and an anal fissure. She has been troubled with anal seepage intermittently. For this she has periodically used Bentyl. She had a colonoscopy on 09/05/2004 which was a normal exam except for some nonspecific anal irritation. She has a family history of colon cancer in a paternal grandmother and colon polyps in her father and sister. She has periodically used Anusol HC suppositories which have diminished her anal seepage.  She presents today with complaints of recurrent anal seepage and a sensation of incomplete evacuation. Her bowel movements have been formed but she has to wipe copiously after each bowel movement and often times about 20 minutes after a bowel movement she will feel as if there is something wet near her anus and has to wipe and has some seepage. She occasionally has some blood on the toilet tissue.   Past Medical History  Diagnosis Date  . Ovarian cyst   . PCOS (polycystic ovarian syndrome)   . Erythrodermic psoriasis   . Seizure disorder ([place])   . Hypertension   . Fatty liver 12/26/11  . PAC (premature atrial contraction) 04/02/08  . PVC (premature ventricular contraction) 04/02/08  . Deafness in right ear     congenital  . Anal fissure     Past Surgical History  Procedure Laterality Date  . Cystoscopy    . Liposuction    . Abdominoplasty    . Colonoscopy     Family History  Problem Relation Age of Onset  . Anesthesia problems Neg Hx   . Colon cancer Paternal Grandmother   . Colon polyps Father   . Colon polyps Paternal Aunt   . Heart disease Maternal Grandfather   . Heart disease      uncle  . Diabetes      aunt  . Diabetes     uncle  . Alcoholism Maternal Grandfather    Social History  Substance Use Topics  . Smoking status: Never Smoker   . Smokeless tobacco: Never Used  . Alcohol Use: Yes     Comment: socially   Current Outpatient Prescriptions  Medication Sig Dispense Refill  . cetirizine (ZYRTEC) 10 MG tablet Take 10 mg by mouth daily.    Marland Kitchen dicyclomine (BENTYL) 20 MG tablet Take 1 tablet (20 mg total) by mouth 2 (two) times daily. 60 tablet 1  . levETIRAcetam (KEPPRA XR) 500 MG 24 hr tablet Take 1,000 mg by mouth at bedtime.    . norethindrone-ethinyl estradiol-iron (MICROGESTIN FE,GILDESS FE,LOESTRIN FE) 1.5-30 MG-MCG tablet Take 1 tablet by mouth at bedtime.    . hydrocortisone (ANUSOL-HC) 25 MG suppository Place 1 suppository (25 mg total) rectally 2 (two) times daily. 20 suppository 1   No current facility-administered medications for this visit.   Allergies  Allergen Reactions  . Iohexol      Desc: HIVES,ICHINGING,NEEDS PRE.MEDS   . Penicillins Hives and Itching  . Clindamycin Rash     Review of Systems: Gen: Denies any fever, chills, sweats, anorexia, fatigue, weakness, malaise, weight loss, and sleep disorder CV: Denies chest pain, angina, palpitations, syncope, orthopnea, PND, peripheral edema, and claudication. Resp: Denies dyspnea at rest, dyspnea with exercise, cough, sputum, wheezing, coughing up blood, and  pleurisy. GI: Denies vomiting blood, jaundice, and fecal incontinence.   Denies dysphagia or odynophagia. GU : Denies urinary burning, blood in urine, urinary frequency, urinary hesitancy, nocturnal urination, and urinary incontinence. MS: Denies joint pain, limitation of movement, and swelling, stiffness, low back pain, extremity pain. Denies muscle weakness, cramps, atrophy.  Derm: Denies rash, itching, dry skin, hives, moles, warts, or unhealing ulcers.  Psych: Denies depression, anxiety, memory loss, suicidal ideation, hallucinations, paranoia, and confusion. Heme: Denies  bruising, bleeding, and enlarged lymph nodes. Neuro:  Denies any headaches, dizziness, paresthesia Endo:  Denies any problems with DM, thyroid, adrenal    Physical Exam: BP 125/64 mmHg  Pulse 70  Ht 5\' 8"  (1.727 m)  Wt 190 lb (86.183 kg)  BMI 28.90 kg/m2 General: Pleasant, well developed , Caucasian female in no acute distress Head: Normocephalic and atraumatic Eyes:  sclerae anicteric, conjunctiva pink  Ears: Normal auditory acuity Lungs: Clear throughout to auscultation Heart: Regular rate and rhythm Abdomen: Soft, non distended, non-tender. No masses, no hepatomegaly. Normal bowel sounds Rectal: Brown stool, Hemoccult-negative. Digital rectal exam nontender and no palpable ridge noted. Anoscopy reveals internal hemorrhoids. Musculoskeletal: Symmetrical with no gross deformities  Extremities: No edema  Neurological: Alert oriented x 4, grossly nonfocal Psychological:  Alert and cooperative. Normal mood and affect  Assessment and Recommendations: 44 year old female with a history of IBS and an anal fissure, presenting with anal seepage and a sensation of incomplete evacuation as well as intermittent blood on the toilet tissue after bowel movements. Her sensation of incomplete evacuation, seepage, and bleeding are likely due to internal hemorrhoids. She's been advised to use Anusol HC suppositories 1 per rectum twice daily for 10 days. She will also try Benefiber 1 heaping tablespoon in water twice daily to try to bulk her stools. She will be scheduled for a colonoscopy to evaluate for polyps given her family history as well as possible proctitis. If her internal hemorrhoids are noted to be of adequate size, she would like to be scheduled for hemorrhoidal banding in the office.The risks, benefits, and alternatives to colonoscopy with possible biopsy and possible polypectomy were discussed with the patient and they consent to proceed.  The procedure will be scheduled with Dr. Hilarie Fredrickson per  patient request. Further recommendations will be made pending the findings of the above.    Dhani Dannemiller, Vita Barley PA-C 05/31/2015,  Addendum: Reviewed and agree with initial management. Jerene Bears, MD

## 2015-05-31 NOTE — Patient Instructions (Signed)
You have been scheduled for a colonoscopy. Please follow written instructions given to you at your visit today.  Please pick up your prep supplies at the pharmacy within the next 1-3 days. If you use inhalers (even only as needed), please bring them with you on the day of your procedure. Your physician has requested that you go to www.startemmi.com and enter the access code given to you at your visit today. This web site gives a general overview about your procedure. However, you should still follow specific instructions given to you by our office regarding your preparation for the procedure.  We have sent the following medications to your pharmacy for you to pick up at your convenience: [facility]  Start Benefiber i heaping teaspoon in water twice a day.  We have given you hemorrhoid information to read and review.

## 2015-06-03 ENCOUNTER — Telehealth: Payer: Self-pay | Admitting: Physician Assistant

## 2015-06-03 NOTE — Telephone Encounter (Signed)
Left a message for patient to call back. 

## 2015-06-06 NOTE — Telephone Encounter (Signed)
Left a message for patient to call back. 

## 2015-06-06 NOTE — Telephone Encounter (Signed)
Patient left a message that she has started suppositories. States she will continue unless I call her to tell her not too.

## 2015-06-07 ENCOUNTER — Ambulatory Visit (AMBULATORY_SURGERY_CENTER): Payer: 59 | Admitting: Internal Medicine

## 2015-06-07 ENCOUNTER — Encounter: Payer: Self-pay | Admitting: Internal Medicine

## 2015-06-07 VITALS — BP 132/87 | HR 62 | Temp 98.8°F | Resp 18 | Ht 68.0 in | Wt 190.0 lb

## 2015-06-07 DIAGNOSIS — K635 Polyp of colon: Secondary | ICD-10-CM | POA: Diagnosis not present

## 2015-06-07 DIAGNOSIS — K621 Rectal polyp: Secondary | ICD-10-CM

## 2015-06-07 DIAGNOSIS — K625 Hemorrhage of anus and rectum: Secondary | ICD-10-CM

## 2015-06-07 DIAGNOSIS — D122 Benign neoplasm of ascending colon: Secondary | ICD-10-CM | POA: Diagnosis not present

## 2015-06-07 DIAGNOSIS — D123 Benign neoplasm of transverse colon: Secondary | ICD-10-CM

## 2015-06-07 DIAGNOSIS — Z8371 Family history of colonic polyps: Secondary | ICD-10-CM | POA: Diagnosis present

## 2015-06-07 DIAGNOSIS — D129 Benign neoplasm of anus and anal canal: Secondary | ICD-10-CM

## 2015-06-07 DIAGNOSIS — D128 Benign neoplasm of rectum: Secondary | ICD-10-CM

## 2015-06-07 DIAGNOSIS — D125 Benign neoplasm of sigmoid colon: Secondary | ICD-10-CM

## 2015-06-07 MED ORDER — SODIUM CHLORIDE 0.9 % IV SOLN: 500.0000 mL | INTRAVENOUS | Status: DC

## 2015-06-07 NOTE — Progress Notes (Signed)
Called to room to assist during endoscopic procedure.  Patient ID and intended procedure confirmed with present staff. Received instructions for my participation in the procedure from the performing physician.  

## 2015-06-07 NOTE — Progress Notes (Signed)
Stable to RR 

## 2015-06-07 NOTE — Op Note (Signed)
[facility]  [address] [place], [id number]   COLONOSCOPY PROCEDURE REPORT  [name], Pam Graham  MR#: [id number] BIRTHDATE: [birth year] , 44  yrs. old GENDER: female ENDOSCOPIST: Jerene Bears, MD PROCEDURE DATE:  06/07/2015 PROCEDURE:   Colonoscopy, screening and Colonoscopy with snare polypectomy First Screening Colonoscopy - Avg.  risk and is 50 yrs.  old or older - No.  Prior Negative Screening - Now for repeat screening. N/A  History of Adenoma - Now for follow-up colonoscopy & has been > or = to 3 yrs.  N/A  Polyps removed today? Yes ASA CLASS:   Class II INDICATIONS:family history of colonic polyps, rectal bleeding, fecal incontinence. MEDICATIONS: Monitored anesthesia care and Propofol 450 mg IV Lidocaine 40 mg IV  DESCRIPTION OF PROCEDURE:   After the risks benefits and alternatives of the procedure were thoroughly explained, informed consent was obtained.  The digital rectal exam revealed no rectal mass.   The LB 1528  endoscope was introduced through the anus and advanced to the cecum, which was identified by both the appendix and ileocecal valve. No adverse events experienced.   The quality of the prep was good.  (Suprep was used)  The instrument was then slowly withdrawn as the colon was fully examined. Estimated blood loss is zero unless otherwise noted in this procedure report.   COLON FINDINGS: Four sessile polyps ranging from 4 to 37mm in size were found in the transverse colon, ascending colon, rectum, and sigmoid colon.  Polypectomies were performed with a cold snare. The resection was complete, the polyp tissue was completely retrieved and sent to histology.   The examination was otherwise normal.  Retroflexed views revealed internal hemorrhoids. The time to cecum = 5.8 Withdrawal time = 13.9   The scope was withdrawn and the procedure completed. COMPLICATIONS: There were no immediate complications.  ENDOSCOPIC IMPRESSION: 1.   Four  sessile polyps ranging from 4 to 49mm in size were found in the transverse colon, ascending colon, rectum, and sigmoid colon; polypectomies were performed with a cold snare 2.   The examination was otherwise normal  RECOMMENDATIONS: 1.  Await pathology results 2.  Timing of repeat colonoscopy will be determined by pathology findings. 3.  You will receive a letter within 1-2 weeks with the results of your biopsy as well as final recommendations.  Please call my office if you have not received a letter after 3 weeks.  eSigned:  Jerene Bears, MD 06/07/2015 9:20 AM   cc:  the patient, PCP

## 2015-06-07 NOTE — Patient Instructions (Signed)
YOU HAD AN ENDOSCOPIC PROCEDURE TODAY AT THE  ENDOSCOPY CENTER:   Refer to the procedure report that was given to you for any specific questions about what was found during the examination.  If the procedure report does not answer your questions, please call your gastroenterologist to clarify.  If you requested that your care partner not be given the details of your procedure findings, then the procedure report has been included in a sealed envelope for you to review at your convenience later.  YOU SHOULD EXPECT: Some feelings of bloating in the abdomen. Passage of more gas than usual.  Walking can help get rid of the air that was put into your GI tract during the procedure and reduce the bloating. If you had a lower endoscopy (such as a colonoscopy or flexible sigmoidoscopy) you may notice spotting of blood in your stool or on the toilet paper. If you underwent a bowel prep for your procedure, you may not have a normal bowel movement for a few days.  Please Note:  You might notice some irritation and congestion in your nose or some drainage.  This is from the oxygen used during your procedure.  There is no need for concern and it should clear up in a day or so.  SYMPTOMS TO REPORT IMMEDIATELY:   Following lower endoscopy (colonoscopy or flexible sigmoidoscopy):  Excessive amounts of blood in the stool  Significant tenderness or worsening of abdominal pains  Swelling of the abdomen that is new, acute  Fever of 100F or higher   For urgent or emergent issues, a gastroenterologist can be reached at any hour by calling [phone].   DIET: Your first meal following the procedure should be a small meal and then it is ok to progress to your normal diet. Heavy or fried foods are harder to digest and may make you feel nauseous or bloated.  Likewise, meals heavy in dairy and vegetables can increase bloating.  Drink plenty of fluids but you should avoid alcoholic beverages for 24  hours.  ACTIVITY:  You should plan to take it easy for the rest of today and you should NOT DRIVE or use heavy machinery until tomorrow (because of the sedation medicines used during the test).    FOLLOW UP: Our staff will call the number listed on your records the next business day following your procedure to check on you and address any questions or concerns that you may have regarding the information given to you following your procedure. If we do not reach you, we will leave a message.  However, if you are feeling well and you are not experiencing any problems, there is no need to return our call.  We will assume that you have returned to your regular daily activities without incident.  If any biopsies were taken you will be contacted by phone or by letter within the next 1-3 weeks.  Please call us at [phone] if you have not heard about the biopsies in 3 weeks.    SIGNATURES/CONFIDENTIALITY: You and/or your care partner have signed paperwork which will be entered into your electronic medical record.  These signatures attest to the fact that that the information above on your After Visit Summary has been reviewed and is understood.  Full responsibility of the confidentiality of this discharge information lies with you and/or your care-partner.  Read all handouts given to you by your recovery room nurse. 

## 2015-06-08 ENCOUNTER — Telehealth: Payer: Self-pay

## 2015-06-08 NOTE — Telephone Encounter (Signed)
  Follow up Call-  Call back number 06/07/2015  Post procedure Call Back phone  # [phone]  Permission to leave phone message Yes     Patient was called for follow up after procedure on 06-07-15. No answer at the number given. Left a message.

## 2015-06-13 ENCOUNTER — Encounter: Payer: Self-pay | Admitting: *Deleted

## 2015-06-14 ENCOUNTER — Encounter: Payer: Self-pay | Admitting: Internal Medicine

## 2015-07-01 ENCOUNTER — Ambulatory Visit (INDEPENDENT_AMBULATORY_CARE_PROVIDER_SITE_OTHER): Payer: 59 | Admitting: Internal Medicine

## 2015-07-01 ENCOUNTER — Encounter: Payer: Self-pay | Admitting: Internal Medicine

## 2015-07-01 ENCOUNTER — Encounter: Payer: 59 | Admitting: Internal Medicine

## 2015-07-01 VITALS — BP 116/84 | HR 68 | Ht 66.75 in | Wt 193.1 lb

## 2015-07-01 DIAGNOSIS — R151 Fecal smearing: Secondary | ICD-10-CM

## 2015-07-01 DIAGNOSIS — K648 Other hemorrhoids: Secondary | ICD-10-CM | POA: Diagnosis not present

## 2015-07-01 DIAGNOSIS — L290 Pruritus ani: Secondary | ICD-10-CM

## 2015-07-01 NOTE — Patient Instructions (Signed)
Please discontinue Bentyl.  Please purchase the following medications over the counter and take as directed: Benefiber 1 tablespoon daily  HEMORRHOID BANDING PROCEDURE    FOLLOW-UP CARE   1. The procedure you have had should have been relatively painless since the banding of the area involved does not have nerve endings and there is no pain sensation.  The rubber band cuts off the blood supply to the hemorrhoid and the band may fall off as soon as 48 hours after the banding (the band may occasionally be seen in the toilet bowl following a bowel movement). You may notice a temporary feeling of fullness in the rectum which should respond adequately to plain Tylenol or Motrin.  2. Following the banding, avoid strenuous exercise that evening and resume full activity the next day.  A sitz bath (soaking in a warm tub) or bidet is soothing, and can be useful for cleansing the area after bowel movements.     3. To avoid constipation, take two tablespoons of natural wheat bran, natural oat bran, flax, Benefiber or any over the counter fiber supplement and increase your water intake to 7-8 glasses daily.    4. Unless you have been prescribed anorectal medication, do not put anything inside your rectum for two weeks: No suppositories, enemas, fingers, etc.  5. Occasionally, you may have more bleeding than usual after the banding procedure.  This is often from the untreated hemorrhoids rather than the treated one.  Don't be concerned if there is a tablespoon or so of blood.  If there is more blood than this, lie flat with your bottom higher than your head and apply an ice pack to the area. If the bleeding does not stop within a half an hour or if you feel faint, call our office at [phone] or go to the emergency room.  6. Problems are not common; however, if there is a substantial amount of bleeding, severe pain, chills, fever or difficulty passing urine (very rare) or other problems, you should  call us at [phone] or report to the nearest emergency room.  7. Do not stay seated continuously for more than 2-3 hours for a day or two after the procedure.  Tighten your buttock muscles 10-15 times every two hours and take 10-15 deep breaths every 1-2 hours.  Do not spend more than a few minutes on the toilet if you cannot empty your bowel; instead re-visit the toilet at a later time.

## 2015-07-01 NOTE — Progress Notes (Signed)
Patient ID: Pam Graham, female   DOB: 12/23/1970, 45 y.o.   MRN: [id number] Patient presents for consideration of hemorrhoidal banding Recent colonoscopy with polypectomy, internal hemorrhoids observed Long-standing issues with fecal smearing and seepage, perianal itch and intermittent bleeding Tried Benefiber and Bentyl. Doesn't feel Bentyl helps. No longer on Benefiber Sometimes stools are formed and passed easily other times are softer and difficult to wipe afterwards No melena  PROCEDURE NOTE: The patient presents with symptomatic grade 2 internal hemorrhoids, requesting rubber band ligation of her hemorrhoidal disease.  All risks, benefits and alternative forms of therapy were described and informed consent was obtained.  In the Left Lateral Decubitus position anoscopic examination revealed grade 2 internal hemorrhoids in the RA>LL position.  No external hemorrhoids noted, no fissure seen or felt.  No masses.  Normal perianal exam  The anorectum was pre-medicated with 0.125% nitroglycerin ointment The decision was made to band the right anterior internal hemorrhoid, and the [name] was used to perform band ligation without complication.  Digital anorectal examination was then performed to assure proper positioning of the band, and to adjust the banded tissue as required.  The patient was discharged home without pain or other issues.  Dietary and behavioral recommendations were given and along with follow-up instructions.     The following adjunctive treatments were recommended: Resume Benefiber 1 tablespoon daily to try to help bulk the stool and aid in passage/cleanup afterward Discontinue Bentyl  The patient will return as scheduled for  follow-up and possible additional banding as required. No complications were encountered and the patient tolerated the procedure well.

## 2015-07-13 DIAGNOSIS — F419 Anxiety disorder, unspecified: Secondary | ICD-10-CM | POA: Diagnosis not present

## 2015-07-15 ENCOUNTER — Telehealth: Payer: Self-pay | Admitting: Internal Medicine

## 2015-07-15 NOTE — Telephone Encounter (Signed)
Patient advised to start with a tablespoon in a liquid of her choice once a day and can increase to 3 tbsp a day as tolerated.  She will call back for any additional questions or concerns

## 2015-07-22 ENCOUNTER — Ambulatory Visit (INDEPENDENT_AMBULATORY_CARE_PROVIDER_SITE_OTHER): Payer: 59 | Admitting: Internal Medicine

## 2015-07-22 ENCOUNTER — Encounter: Payer: Self-pay | Admitting: Internal Medicine

## 2015-07-22 VITALS — BP 114/78 | HR 80 | Ht 66.75 in | Wt 191.0 lb

## 2015-07-22 DIAGNOSIS — K648 Other hemorrhoids: Secondary | ICD-10-CM

## 2015-07-22 DIAGNOSIS — K641 Second degree hemorrhoids: Secondary | ICD-10-CM

## 2015-07-22 DIAGNOSIS — R151 Fecal smearing: Secondary | ICD-10-CM

## 2015-07-22 NOTE — Progress Notes (Signed)
Patient ID: Pam Graham, female   DOB: [birth year], 45 y.o.   MRN: [id number]    Patient returns today after initial banding appointment on 07/01/2015  Internal hemorrhoid banding for long-standing issues with fecal smearing and seepage,. No itching and intermittent bleeding  she reports improvement and fecal seepage and leakage in estimates this is now occurring maybe half as often as previously  she did start Benefiber one to 1/2 tablespoons daily  PROCEDURE NOTE: The patient presents with symptomatic grade 2 internal hemorrhoids, requesting rubber band ligation of her hemorrhoidal disease.  All risks, benefits and alternative forms of therapy were described and informed consent was obtained.   The anorectum was pre-medicated with  0.125% nitroglycerin ointment The decision was made to band the LL internal hemorrhoid (RA banded at visit #1 on 07/01/15), and the [place] was used to perform band ligation without complication.  Digital anorectal examination was then performed to assure proper positioning of the band, and to adjust the banded tissue as required.  The patient was discharged home without pain or other issues.  Dietary and behavioral recommendations were given and along with follow-up instructions.     The following adjunctive treatments were recommended:  Continue Benefiber 1-2 tablespoons daily  The patient will return as scheduled for  follow-up and possible additional banding as required. No complications were encountered and the patient tolerated the procedure well.

## 2015-07-22 NOTE — Patient Instructions (Signed)
  Next banding is 08/08/15 1030 am   HEMORRHOID BANDING PROCEDURE    FOLLOW-UP CARE   1. The procedure you have had should have been relatively painless since the banding of the area involved does not have nerve endings and there is no pain sensation.  The rubber band cuts off the blood supply to the hemorrhoid and the band may fall off as soon as 48 hours after the banding (the band may occasionally be seen in the toilet bowl following a bowel movement). You may notice a temporary feeling of fullness in the rectum which should respond adequately to plain Tylenol or Motrin.  2. Following the banding, avoid strenuous exercise that evening and resume full activity the next day.  A sitz bath (soaking in a warm tub) or bidet is soothing, and can be useful for cleansing the area after bowel movements.     3. To avoid constipation, take two tablespoons of natural wheat bran, natural oat bran, flax, Benefiber or any over the counter fiber supplement and increase your water intake to 7-8 glasses daily.    4. Unless you have been prescribed anorectal medication, do not put anything inside your rectum for two weeks: No suppositories, enemas, fingers, etc.  5. Occasionally, you may have more bleeding than usual after the banding procedure.  This is often from the untreated hemorrhoids rather than the treated one.  Don't be concerned if there is a tablespoon or so of blood.  If there is more blood than this, lie flat with your bottom higher than your head and apply an ice pack to the area. If the bleeding does not stop within a half an hour or if you feel faint, call our office at [phone] or go to the emergency room.  6. Problems are not common; however, if there is a substantial amount of bleeding, severe pain, chills, fever or difficulty passing urine (very rare) or other problems, you should call us at [phone] or report to the nearest emergency room.  7. Do not stay seated continuously  for more than 2-3 hours for a day or two after the procedure.  Tighten your buttock muscles 10-15 times every two hours and take 10-15 deep breaths every 1-2 hours.  Do not spend more than a few minutes on the toilet if you cannot empty your bowel; instead re-visit the toilet at a later time.

## 2015-07-27 DIAGNOSIS — F419 Anxiety disorder, unspecified: Secondary | ICD-10-CM | POA: Diagnosis not present

## 2015-07-29 ENCOUNTER — Encounter: Payer: Self-pay | Admitting: *Deleted

## 2015-07-29 MED FILL — NAPROXEN 500 MG TABLET: 500 | 4 days supply | Qty: 12 | Fill #0

## 2015-07-29 MED FILL — AMOXICILLIN 500 MG CAPSULE: 500 | 7 days supply | Qty: 28 | Fill #0

## 2015-08-01 MED FILL — AZITHROMYCIN 250 MG TABLET: 250 | 5 days supply | Qty: 6 | Fill #0

## 2015-08-08 ENCOUNTER — Ambulatory Visit (INDEPENDENT_AMBULATORY_CARE_PROVIDER_SITE_OTHER): Payer: 59 | Admitting: Internal Medicine

## 2015-08-08 ENCOUNTER — Encounter: Payer: Self-pay | Admitting: Internal Medicine

## 2015-08-08 VITALS — BP 108/66 | HR 74 | Ht 66.75 in

## 2015-08-08 DIAGNOSIS — K648 Other hemorrhoids: Secondary | ICD-10-CM

## 2015-08-08 DIAGNOSIS — R151 Fecal smearing: Secondary | ICD-10-CM

## 2015-08-08 NOTE — Progress Notes (Signed)
Patient ID: Pam Graham, female   DOB: 09-03-1970, 45 y.o.   MRN: [id number] Patient returns today after 2 banding appointments on 07/01/2015, 07/22/2015 Internal hemorrhoid banding for long-standing issues with fecal smearing and seepage,. No itching and intermittent bleeding she reports improvement and fecal seepage and leakage, dramatic improvement about which she is very happy she has continued Benefiber one to 1/2 tablespoons daily  PROCEDURE NOTE: The patient presents with symptomatic grade 2 internal hemorrhoids, requesting rubber band ligation of her hemorrhoidal disease. All risks, benefits and alternative forms of therapy were described and informed consent was obtained.   The anorectum was pre-medicated with 0.125% nitroglycerin ointment The decision was made to band the RP internal hemorrhoid (RA banded at visit #1 on 07/01/15, LL on 2nd visit), and the [place] was used to perform band ligation without complication. Digital anorectal examination was then performed to assure proper positioning of the band, and to adjust the banded tissue as required. The patient was discharged home without pain or other issues. Dietary and behavioral recommendations were given and along with follow-up instructions.   The following adjunctive treatments were recommended: Continue Benefiber 1-2 tablespoons daily  The patient will return as needed. No complications were encountered and the patient tolerated the procedure well.

## 2015-08-08 NOTE — Patient Instructions (Signed)

## 2015-08-10 DIAGNOSIS — F419 Anxiety disorder, unspecified: Secondary | ICD-10-CM | POA: Diagnosis not present

## 2015-08-12 DIAGNOSIS — F419 Anxiety disorder, unspecified: Secondary | ICD-10-CM | POA: Diagnosis not present

## 2015-08-17 DIAGNOSIS — F419 Anxiety disorder, unspecified: Secondary | ICD-10-CM | POA: Diagnosis not present

## 2015-08-19 DIAGNOSIS — F419 Anxiety disorder, unspecified: Secondary | ICD-10-CM | POA: Diagnosis not present

## 2015-08-19 MED FILL — LEVETIRACETAM ER 500 MG TAB: 500 | 90 days supply | Qty: 180 | Fill #0

## 2015-08-22 MED FILL — LARIN 24 FE 1 MG-20 MCG TAB: 1-20 | 84 days supply | Qty: 84 | Fill #0 | Status: TO

## 2015-08-23 DIAGNOSIS — F419 Anxiety disorder, unspecified: Secondary | ICD-10-CM | POA: Diagnosis not present

## 2015-09-10 DIAGNOSIS — F419 Anxiety disorder, unspecified: Secondary | ICD-10-CM | POA: Diagnosis not present

## 2015-09-28 ENCOUNTER — Telehealth: Payer: Self-pay | Admitting: Internal Medicine

## 2015-09-28 NOTE — Telephone Encounter (Signed)
Patient asking for advice on switching to another type of fiber.  She is asking if she should add Metamucil along with her Benefiber.  She is advised if she wishes to switch to Metamucil to start off with just the metamucil until she adjusts to it and if she feels that she needs benefiber she can add that it later.  She feels that her stools are still too loose on Benefiber.  She will call back for any additional questions or concerns.

## 2015-10-18 ENCOUNTER — Telehealth: Payer: Self-pay | Admitting: Internal Medicine

## 2015-10-18 NOTE — Telephone Encounter (Signed)
Pt aware and can make this appt. [facility] slot at 4pm on 11/10/15 blocked. Barbera Setters can you open this slot so I can put the appt in?

## 2015-10-18 NOTE — Telephone Encounter (Signed)
You can block the 4pm [facility] procedure on 11/10/2015 and place her in a banding slot in the office Please let her know additional banding may be needed that day to control symptoms. JMP

## 2015-10-18 NOTE — Telephone Encounter (Signed)
Pt called wanting to schedule an appt with Dr. Hilarie Fredrickson. States she is having to rewipe a lot again, her stools are still soft despite taking 2 tablespoons of fiber daily and she is seeing BRB on the tissue again. There are no available appointments with Dr. Hilarie Fredrickson. Please advise.

## 2015-10-19 NOTE — Telephone Encounter (Signed)
Pam Graham opened the slot. Pt scheduled for 11/10/15@4pm  for possible banding. Pt aware of appt.

## 2015-11-01 ENCOUNTER — Encounter (HOSPITAL_COMMUNITY): Payer: Self-pay | Admitting: Emergency Medicine

## 2015-11-01 ENCOUNTER — Emergency Department (HOSPITAL_COMMUNITY)
Admission: EM | Admit: 2015-11-01 | Discharge: 2015-11-02 | Disposition: A | Discharge status: Other Acute Inpatient Hospital | Payer: BLUE CROSS/BLUE SHIELD | Attending: Emergency Medicine | Admitting: Emergency Medicine

## 2015-11-01 DIAGNOSIS — Z86018 Personal history of other benign neoplasm: Secondary | ICD-10-CM | POA: Diagnosis not present

## 2015-11-01 DIAGNOSIS — Z8639 Personal history of other endocrine, nutritional and metabolic disease: Secondary | ICD-10-CM | POA: Insufficient documentation

## 2015-11-01 DIAGNOSIS — Z8719 Personal history of other diseases of the digestive system: Secondary | ICD-10-CM | POA: Insufficient documentation

## 2015-11-01 DIAGNOSIS — Z8742 Personal history of other diseases of the female genital tract: Secondary | ICD-10-CM | POA: Insufficient documentation

## 2015-11-01 DIAGNOSIS — G40909 Epilepsy, unspecified, not intractable, without status epilepticus: Secondary | ICD-10-CM | POA: Diagnosis not present

## 2015-11-01 DIAGNOSIS — Z880 Allergy status to penicillin: Secondary | ICD-10-CM | POA: Diagnosis not present

## 2015-11-01 DIAGNOSIS — H9191 Unspecified hearing loss, right ear: Secondary | ICD-10-CM | POA: Diagnosis not present

## 2015-11-01 DIAGNOSIS — I10 Essential (primary) hypertension: Secondary | ICD-10-CM | POA: Diagnosis not present

## 2015-11-01 DIAGNOSIS — F329 Major depressive disorder, single episode, unspecified: Secondary | ICD-10-CM | POA: Insufficient documentation

## 2015-11-01 DIAGNOSIS — Z79818 Long term (current) use of other agents affecting estrogen receptors and estrogen levels: Secondary | ICD-10-CM | POA: Insufficient documentation

## 2015-11-01 DIAGNOSIS — F419 Anxiety disorder, unspecified: Secondary | ICD-10-CM | POA: Diagnosis not present

## 2015-11-01 DIAGNOSIS — R45851 Suicidal ideations: Secondary | ICD-10-CM | POA: Diagnosis present

## 2015-11-01 DIAGNOSIS — R000 Tachycardia, unspecified: Secondary | ICD-10-CM | POA: Insufficient documentation

## 2015-11-01 DIAGNOSIS — F32A Depression, unspecified: Secondary | ICD-10-CM

## 2015-11-01 DIAGNOSIS — Z3202 Encounter for pregnancy test, result negative: Secondary | ICD-10-CM | POA: Diagnosis not present

## 2015-11-01 DIAGNOSIS — Z79899 Other long term (current) drug therapy: Secondary | ICD-10-CM | POA: Diagnosis not present

## 2015-11-01 DIAGNOSIS — Z872 Personal history of diseases of the skin and subcutaneous tissue: Secondary | ICD-10-CM | POA: Diagnosis not present

## 2015-11-01 HISTORY — DX: Anxiety disorder, unspecified: F41.9

## 2015-11-01 HISTORY — DX: Depression, unspecified: F32.A

## 2015-11-01 HISTORY — DX: Major depressive disorder, single episode, unspecified: F32.9

## 2015-11-01 LAB — CBC
HCT: 43.4 % (ref 36.0–46.0)
Hemoglobin: 14.8 g/dL (ref 12.0–15.0)
MCH: 31.7 pg (ref 26.0–34.0)
MCHC: 34.1 g/dL (ref 30.0–36.0)
MCV: 92.9 fL (ref 78.0–100.0)
PLATELETS: 239 10*3/uL (ref 150–400)
RBC: 4.67 MIL/uL (ref 3.87–5.11)
RDW: 12.5 % (ref 11.5–15.5)
WBC: 5.3 10*3/uL (ref 4.0–10.5)

## 2015-11-01 LAB — COMPREHENSIVE METABOLIC PANEL
ALK PHOS: 72 U/L (ref 38–126)
ALT: 19 U/L (ref 14–54)
AST: 27 U/L (ref 15–41)
Albumin: 4.3 g/dL (ref 3.5–5.0)
Anion gap: 11 (ref 5–15)
BUN: 8 mg/dL (ref 6–20)
CALCIUM: 9.1 mg/dL (ref 8.9–10.3)
CHLORIDE: 103 mmol/L (ref 101–111)
CO2: 26 mmol/L (ref 22–32)
CREATININE: 0.83 mg/dL (ref 0.44–1.00)
Glucose, Bld: 108 mg/dL — ABNORMAL HIGH (ref 65–99)
Potassium: 3.5 mmol/L (ref 3.5–5.1)
Sodium: 140 mmol/L (ref 135–145)
Total Bilirubin: 0.9 mg/dL (ref 0.3–1.2)
Total Protein: 7.5 g/dL (ref 6.5–8.1)

## 2015-11-01 LAB — RAPID URINE DRUG SCREEN, HOSP PERFORMED
Amphetamines: NOT DETECTED
Barbiturates: NOT DETECTED
Benzodiazepines: NOT DETECTED
Cocaine: NOT DETECTED
Opiates: NOT DETECTED
Tetrahydrocannabinol: NOT DETECTED

## 2015-11-01 LAB — PREGNANCY, URINE: Preg Test, Ur: NEGATIVE

## 2015-11-01 LAB — ACETAMINOPHEN LEVEL: Acetaminophen (Tylenol), Serum: <10 ug/mL — ABNORMAL LOW (ref 10–30)

## 2015-11-01 LAB — ETHANOL

## 2015-11-01 LAB — SALICYLATE LEVEL

## 2015-11-01 MED ORDER — LORAZEPAM 1 MG PO TABS: 1.0000 mg | ORAL_TABLET | Freq: Three times a day (TID) | ORAL | Status: DC | PRN

## 2015-11-01 MED ORDER — ACETAMINOPHEN 325 MG PO TABS: 650.0000 mg | ORAL_TABLET | ORAL | Status: DC | PRN

## 2015-11-01 MED ORDER — ALUM & MAG HYDROXIDE-SIMETH 200-200-20 MG/5ML PO SUSP: 30.0000 mL | ORAL | Status: DC | PRN

## 2015-11-01 MED ORDER — LEVETIRACETAM ER 500 MG PO TB24: 1000.0000 mg | ORAL_TABLET | Freq: Every day | ORAL | Status: DC

## 2015-11-01 MED ORDER — LORATADINE 10 MG PO TABS: 10.0000 mg | ORAL_TABLET | Freq: Every day | ORAL | Status: DC

## 2015-11-01 MED ORDER — IBUPROFEN 200 MG PO TABS: 600.0000 mg | ORAL_TABLET | Freq: Three times a day (TID) | ORAL | Status: DC | PRN

## 2015-11-01 MED ORDER — LEVETIRACETAM ER 500 MG PO TB24
1000.0000 mg | ORAL_TABLET | Freq: Every day | ORAL | Status: DC
  Administered 2015-11-01: 1000 mg via ORAL
  Filled 2015-11-01: qty 2

## 2015-11-01 MED ORDER — ONDANSETRON HCL 4 MG PO TABS: 4.0000 mg | ORAL_TABLET | Freq: Three times a day (TID) | ORAL | Status: DC | PRN

## 2015-11-01 MED ORDER — ZOLPIDEM TARTRATE 5 MG PO TABS
5.0000 mg | ORAL_TABLET | Freq: Every evening | ORAL | Status: DC | PRN
  Filled 2015-11-01: qty 1
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
## 2015-11-01 NOTE — ED Notes (Signed)
Pt received calm and visible in room. Patient denies SI, HI, A/ V H, and pain at this time. No complaints, stable, in no acute distress. Pt informed about Q15 minute rounds and monitoring via Security Cameras to continue. Pt reports intermittent anxiety and SI but contracts for safety.

## 2015-11-01 NOTE — ED Notes (Signed)
Depression and suicidal thoughts off and on since march. Patient is tearful at triage. Hx anxiety and depression was on Zoloft but took herself off a while ago and now her symptoms are getting worse. Family member supportive and is at bedside.

## 2015-11-01 NOTE — ED Provider Notes (Signed)
CSN: [id number]     Arrival date & time 11/01/15  1456 History   First MD Initiated Contact with Patient 11/01/15 1821     Chief Complaint  Patient presents with  . Depression  . Suicidal     (Consider location/radiation/quality/duration/timing/severity/associated sxs/prior Treatment) HPI Comments: Pam Graham is a 45 y.o. female with a PMHx of depression, anxiety, psoriasis, PCOS, seizures, HTN, fatty liver, PAC/PVCs, IBS, and other medical conditions listed below, who presents to the ED with complaints of worsening depression and anxiety over the last 3 months with increasing suicidal ideations over the last 2 months. She reports that she has been under more stress recently, changed jobs and feels that this has worsened her depression and anxiety. She sees Dr. [name] (psychologist) at [facility] mental health and he has been helping her with her depression and anxiety, she feels that now that she has been having suicidal ideations she needs more intensive help then he can provide outpatient. Check she has an appointment with him tomorrow, but she does not feel that she should wait any longer to get help. Denies having a plan for suicide, denies HI/AVH, denies EtOH use or illicit drug use. Denies smoking. She is not currently on psych meds, was previously on zoloft last year but took herself off of it and isn't on anything at this time for depression/anxiety. She is here voluntarily. She has no medical complaints at this time. She does not have a menses due to PCOS causing irregular menses, but hasn't been sexually active in several months, denies possibility of pregnancy, and is on OCPs which she brought with her today. She came with a friend who was also concerned about the pt's suicidal thoughts.   Patient is a 45 y.o. female presenting with depression and mental health disorder. The history is provided by the patient. No language interpreter was used.  Depression This is a chronic problem.  The current episode started more than 1 month ago. The problem occurs constantly. The problem has been gradually worsening. Pertinent negatives include no abdominal pain, arthralgias, chest pain, chills, fever, myalgias, nausea, numbness, urinary symptoms, vomiting or weakness. The symptoms are aggravated by stress. She has tried nothing for the symptoms. The treatment provided no relief.  Mental Health Problem Presenting symptoms: depression and suicidal thoughts   Presenting symptoms: no hallucinations and no homicidal ideas   Patient accompanied by:  Friend Onset quality:  Gradual Duration:  3 months Timing:  Constant Progression:  Worsening Chronicity:  Recurrent Context: stressful life event   Treatment compliance:  Untreated Relieved by:  None tried Worsened by:  Nothing tried Ineffective treatments:  None tried Associated symptoms: anxiety   Associated symptoms: no abdominal pain and no chest pain   Risk factors: hx of mental illness     Past Medical History  Diagnosis Date  . Ovarian cyst   . PCOS (polycystic ovarian syndrome)   . Erythrodermic psoriasis   . Seizure disorder ([place])   . Hypertension   . Fatty liver 12/26/11  . PAC (premature atrial contraction) 04/02/08  . PVC (premature ventricular contraction) 04/02/08  . Deafness in right ear     congenital  . Anal fissure   . IBS (irritable bowel syndrome)   . Anal fissure   . Tubular adenoma of colon   . Depression   . Anxiety    Past Surgical History  Procedure Laterality Date  . Cystoscopy    . Liposuction    . Abdominoplasty    .  Colonoscopy     Family History  Problem Relation Age of Onset  . Anesthesia problems Neg Hx   . Colon cancer Paternal Grandmother   . Colon polyps Father   . Colon polyps Paternal Aunt   . Heart disease Maternal Grandfather   . Heart disease      uncle  . Diabetes      aunt  . Diabetes      uncle  . Alcoholism Maternal Grandfather    Social History  Substance Use Topics   . Smoking status: Never Smoker   . Smokeless tobacco: Never Used  . Alcohol Use: Yes     Comment: socially   OB History    Gravida Para Term Preterm AB TAB SAB Ectopic Multiple Living   0 0             Review of Systems  Constitutional: Negative for fever and chills.  Respiratory: Negative for shortness of breath.   Cardiovascular: Negative for chest pain.  Gastrointestinal: Negative for nausea, vomiting, abdominal pain, diarrhea and constipation.  Genitourinary: Negative for dysuria and hematuria.  Musculoskeletal: Negative for myalgias and arthralgias.  Skin: Negative for color change.  Allergic/Immunologic: Negative for immunocompromised state.  Neurological: Negative for weakness and numbness.  Psychiatric/Behavioral: Positive for depression and suicidal ideas. Negative for homicidal ideas, hallucinations and confusion. The patient is nervous/anxious.    10 Systems reviewed and are negative for acute change except as noted in the HPI.    Allergies  Iohexol; Penicillins; and Clindamycin  Home Medications   Prior to Admission medications   Medication Sig Start Date End Date Taking? Authorizing Provider  cetirizine (ZYRTEC) 10 MG tablet Take 10 mg by mouth daily.   Yes Historical Provider, MD  ibuprofen (ADVIL,MOTRIN) 200 MG tablet Take 400 mg by mouth every 6 (six) hours as needed for moderate pain.   Yes Historical Provider, MD  Ketotifen Fumarate (ALLERGY EYE DROPS OP) Apply 1 drop to eye daily as needed (allergies).   Yes Historical Provider, MD  levETIRAcetam (KEPPRA XR) 500 MG 24 hr tablet Take 1,000 mg by mouth at bedtime.   Yes Historical Provider, MD  norethindrone-ethinyl estradiol-iron (MICROGESTIN FE,GILDESS FE,LOESTRIN FE) 1.5-30 MG-MCG tablet Take 1 tablet by mouth at bedtime.   Yes Historical Provider, MD   BP 157/122 mmHg  Pulse 118  Temp(Src) 98.7 F (37.1 C) (Oral)  Resp 18  Ht 5\' 7"  (1.702 m)  Wt 81.647 kg  BMI 28.19 kg/m2  SpO2 95% Physical Exam   Constitutional: She is oriented to person, place, and time. Vital signs are normal. She appears well-developed and well-nourished.  Non-toxic appearance. No distress.  Afebrile, nontoxic, NAD, pleasant and cooperative although tearful at times  HENT:  Head: Normocephalic and atraumatic.  Mouth/Throat: Oropharynx is clear and moist and mucous membranes are normal.  Eyes: Conjunctivae and EOM are normal. Right eye exhibits no discharge. Left eye exhibits no discharge.  Neck: Normal range of motion. Neck supple.  Cardiovascular: Normal rate, regular rhythm, normal heart sounds and intact distal pulses.  Exam reveals no gallop and no friction rub.   No murmur heard. Initially tachycardic which resolved upon exam  Pulmonary/Chest: Effort normal and breath sounds normal. No respiratory distress. She has no decreased breath sounds. She has no wheezes. She has no rhonchi. She has no rales.  Abdominal: Soft. Normal appearance and bowel sounds are normal. She exhibits no distension. There is no tenderness. There is no rigidity, no rebound, no guarding, no CVA tenderness,  no tenderness at McBurney's point and negative Murphy's sign.  Musculoskeletal: Normal range of motion.  Neurological: She is alert and oriented to person, place, and time. She has normal strength. No sensory deficit.  Skin: Skin is warm, dry and intact. No rash noted.  Psychiatric: Her mood appears anxious. She is not actively hallucinating. She exhibits a depressed mood. She expresses suicidal ideation. She expresses no homicidal ideation. She expresses no suicidal plans and no homicidal plans.  Anxious and depressed affect, very pleasant and cooperative, easily consolable and calmed down when she cries. Endorsing SI without a specific plan, denies HI/AVH.   Nursing note and vitals reviewed.   ED Course  Procedures (including critical care time) Labs Review Labs Reviewed  COMPREHENSIVE METABOLIC PANEL - Abnormal; Notable for the  following:    Glucose, Bld 108 (*)    All other components within normal limits  ACETAMINOPHEN LEVEL - Abnormal; Notable for the following:    Acetaminophen (Tylenol), Serum <10 (*)    All other components within normal limits  ETHANOL  SALICYLATE LEVEL  CBC  URINE RAPID DRUG SCREEN, HOSP PERFORMED  PREGNANCY, URINE    Imaging Review No results found. I have personally reviewed and evaluated these images and lab results as part of my medical decision-making.   EKG Interpretation None      MDM   Final diagnoses:  Suicidal ideation  Depression  Essential hypertension    45 y.o. female here with SI without a plan, depression/anxiety worsening for the last several months. Here voluntarily, very pleasant and cooperative, tearful at times but easily calmed down during exam. Denies HI/AVH, EtOH use, or illicit drug use. If she tries to leave she will need to be IVC'd, but I feel confident she will stay for the help she came here for. Clearance labs all unremarkable. Pt doesn't have menses due to PCOS, but is on OCPs and denies being sexually active in several months, denies possibility of pregnancy-- will add-on urine preg test to ensure she is not pregnant, but pt medically cleared at this time, home meds ordered (NOTE THAT SHE HAS HER OCPs AND CAN TAKE HER SUPPLY OF THESE), holding orders in, and TTS consulted. Please see TTS notes for further documentation of care/dispo.   BP 144/99 mmHg  Pulse 112  Temp(Src) 98.2 F (36.8 C) (Oral)  Resp 18  Ht 5\' 7"  (1.702 m)  Wt 81.647 kg  BMI 28.19 kg/m2  SpO2 100%  Meds ordered this encounter  Medications  . loratadine (CLARITIN) tablet 10 mg    Sig:   . levETIRAcetam (KEPPRA XR) 24 hr tablet 1,000 mg    Sig:   . alum & mag hydroxide-simeth (MAALOX/MYLANTA) 200-200-20 MG/5ML suspension 30 mL    Sig:   . ondansetron (ZOFRAN) tablet 4 mg    Sig:   . zolpidem (AMBIEN) tablet 5 mg    Sig:   . ibuprofen (ADVIL,MOTRIN) tablet 600 mg     Sig:   . acetaminophen (TYLENOL) tablet 650 mg    Sig:   . LORazepam (ATIVAN) tablet 1 mg    Sig:      Tandi Hanko Camprubi-Soms, PA-C 11/01/15 1852  ADDENDUM: Upreg neg. Medically cleared, no changes to meds need to be done. See TTS consult notes for further documentation of care.   Nicholette Dolson Camprubi-Soms, PA-C 11/01/15 2003  Sherwood Gambler, MD 11/02/15 1659

## 2015-11-02 ENCOUNTER — Encounter (HOSPITAL_COMMUNITY): Payer: Self-pay

## 2015-11-02 ENCOUNTER — Inpatient Hospital Stay (HOSPITAL_COMMUNITY)
Admission: AD | Admit: 2015-11-02 | Discharge: 2015-11-05 | DRG: 885 | Disposition: A | Discharge status: Home / Self Care | Payer: BLUE CROSS/BLUE SHIELD | Source: Intra-hospital | Attending: Psychiatry | Admitting: Psychiatry

## 2015-11-02 DIAGNOSIS — F329 Major depressive disorder, single episode, unspecified: Secondary | ICD-10-CM | POA: Diagnosis present

## 2015-11-02 DIAGNOSIS — F332 Major depressive disorder, recurrent severe without psychotic features: Principal | ICD-10-CM

## 2015-11-02 DIAGNOSIS — H9191 Unspecified hearing loss, right ear: Secondary | ICD-10-CM | POA: Diagnosis present

## 2015-11-02 DIAGNOSIS — R45851 Suicidal ideations: Secondary | ICD-10-CM | POA: Diagnosis present

## 2015-11-02 LAB — LIPID PANEL
Cholesterol: 208 mg/dL — ABNORMAL HIGH (ref 0–200)
HDL: 39 mg/dL — ABNORMAL LOW (ref >40)
LDL Cholesterol: 146 mg/dL — ABNORMAL HIGH (ref 0–99)
Total CHOL/HDL Ratio: 5.3 RATIO
Triglycerides: 115 mg/dL (ref <150)
VLDL: 23 mg/dL (ref 0–40)

## 2015-11-02 LAB — TSH: TSH: 4.166 u[IU]/mL (ref 0.350–4.500)

## 2015-11-02 MED ORDER — HYDROXYZINE HCL 25 MG PO TABS
25.0000 mg | ORAL_TABLET | Freq: Four times a day (QID) | ORAL | Status: DC | PRN
  Administered 2015-11-02 – 2015-11-05 (×4): 25 mg via ORAL
  Filled 2015-11-02 (×5): qty 1

## 2015-11-02 MED ORDER — MAGNESIUM HYDROXIDE 400 MG/5ML PO SUSP: 30.0000 mL | Freq: Every day | ORAL | Status: DC | PRN

## 2015-11-02 MED ORDER — ACETAMINOPHEN 325 MG PO TABS: 650.0000 mg | ORAL_TABLET | Freq: Four times a day (QID) | ORAL | Status: DC | PRN

## 2015-11-02 MED ORDER — FLUOXETINE HCL 20 MG PO CAPS
20.0000 mg | ORAL_CAPSULE | Freq: Every day | ORAL | Status: DC
  Administered 2015-11-02 – 2015-11-05 (×4): 20 mg via ORAL
  Filled 2015-11-02 (×7): qty 1

## 2015-11-02 MED ORDER — LEVETIRACETAM ER 500 MG PO TB24
1000.0000 mg | ORAL_TABLET | Freq: Every day | ORAL | Status: DC
  Administered 2015-11-02 – 2015-11-04 (×3): 1000 mg via ORAL
  Filled 2015-11-02 (×6): qty 2

## 2015-11-02 MED ORDER — ENSURE ENLIVE PO LIQD
237.0000 mL | Freq: Two times a day (BID) | ORAL | Status: DC
  Administered 2015-11-02 – 2015-11-04 (×2): 237 mL via ORAL

## 2015-11-02 MED ORDER — LORATADINE 10 MG PO TABS
10.0000 mg | ORAL_TABLET | Freq: Every day | ORAL | Status: DC
  Administered 2015-11-02 – 2015-11-05 (×4): 10 mg via ORAL
  Filled 2015-11-02 (×7): qty 1

## 2015-11-02 MED ORDER — NORETHIN ACE-ETH ESTRAD-FE 1.5-30 MG-MCG PO TABS
1.0000 | ORAL_TABLET | Freq: Every day | ORAL | Status: DC
  Administered 2015-11-02 – 2015-11-04 (×3): 1 via ORAL

## 2015-11-02 MED ORDER — TRAZODONE HCL 50 MG PO TABS
50.0000 mg | ORAL_TABLET | Freq: Every evening | ORAL | Status: DC | PRN
  Administered 2015-11-02 – 2015-11-04 (×3): 50 mg via ORAL
  Filled 2015-11-02 (×3): qty 1

## 2015-11-02 MED ORDER — ALUM & MAG HYDROXIDE-SIMETH 200-200-20 MG/5ML PO SUSP: 30.0000 mL | ORAL | Status: DC | PRN

## 2015-11-02 MED ORDER — IBUPROFEN 400 MG PO TABS
400.0000 mg | ORAL_TABLET | Freq: Four times a day (QID) | ORAL | Status: DC | PRN
Start: 2015-11-02 — End: 2015-11-05

## 2015-11-02 MED ORDER — TRAZODONE HCL 50 MG PO TABS
50.0000 mg | ORAL_TABLET | Freq: Every evening | ORAL | Status: DC | PRN
Start: 2015-11-02 — End: 2015-11-02
  Administered 2015-11-02: 50 mg via ORAL
  Filled 2015-11-02 (×6): qty 1

## 2015-11-02 NOTE — Progress Notes (Signed)
[facility] Group Notes:  (Nursing/MHT/Case Management/Adjunct)  Date:  11/02/2015  Time:  10:26 AM  Type of Therapy:  Psychoeducational Skills  Participation Level:  Did Not Attend  Participation Quality:    Affect:    Cognitive:    Insight:    Engagement in Group:    Modes of Intervention:    Summary of Progress/Problems:  Pam Graham 11/02/2015, 10:26 AM
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
## 2015-11-02 NOTE — Progress Notes (Signed)
NUTRITION ASSESSMENT  Pt identified as at risk on the Malnutrition Screen Tool  INTERVENTION: 1. Educated patient on the importance of nutrition and encouraged intake of food and beverages. 2. Discussed weight goals. 3. Supplements: continue Ensure Enlive BID, each supplement provides 350 kcal and 20 grams of protein   NUTRITION DIAGNOSIS: Unintentional weight loss related to sub-optimal intake as evidenced by pt report.   Goal: Pt to meet >/= 90% of their estimated nutrition needs.  Monitor:  PO intake  Assessment:  Pt screened for MST. Pt admitted with SI on and off since March. Pt with hx of anxiety and depression. Pt admitted voluntarily. Per review, pt has lost 19 lbs (10% body weight) in the past 4.5-5 months which is significant for time frame. Ensure Jeanne Ivan has already been ordered BID.   45 y.o. female  Height: Ht Readings from Last 1 Encounters:  11/02/15 5\' 6"  (1.676 m)    Weight: Wt Readings from Last 1 Encounters:  11/02/15 172 lb (78.019 kg)    Weight Hx: Wt Readings from Last 10 Encounters:  11/02/15 172 lb (78.019 kg)  11/01/15 180 lb (81.647 kg)  07/22/15 191 lb (86.637 kg)  07/01/15 193 lb 2 oz (87.601 kg)  06/07/15 190 lb (86.183 kg)  05/31/15 190 lb (86.183 kg)  01/28/15 208 lb (94.348 kg)  12/24/14 203 lb (92.08 kg)  12/16/12 197 lb 8 oz (89.585 kg)  09/17/11 190 lb 6 oz (86.354 kg)    BMI:  Body mass index is 27.77 kg/(m^2). Pt meets criteria for overweight based on current BMI.  Estimated Nutritional Needs: Kcal: 25-30 kcal/kg Protein: > 1 gram protein/kg Fluid: 1 ml/kcal  Diet Order: Diet regular Room service appropriate?: Yes; Fluid consistency:: Thin Pt is also offered choice of unit snacks mid-morning and mid-afternoon.  Pt is eating as desired.   Lab results and medications reviewed.      Jarome Matin, RD, LDN Inpatient Clinical Dietitian Pager # [phone] After hours/weekend pager # [phone]

## 2015-11-02 NOTE — Progress Notes (Signed)
D: Pt was in her room with visitors upon initial approach.  Pt has depressed affect and mood.  She reports her day was "good."  Pt reports her goal is to "sleep well" and "I'd like to attend the stress management group."  Pt reports she had a good visit with her mother and 2 brothers tonight.  Pt denies SI/HI, denies hallucinations, denies pain.  Pt has been visible in milieu interacting with peers and staff appropriately.  Pt attended evening group.   A: Introduced self to pt.  Met with pt and offered support and encouragement.  Actively listened to pt.  Medications administered per order.  PRN medication administered for sleep. R: Pt is compliant with medications.  Pt verbally contracts for safety.  Will continue to monitor and assess.

## 2015-11-02 NOTE — ED Notes (Signed)
Pt transported to [facility]. Pt denies HI SI at this time. Vss. Report called to Maudie Mercury, all belongings transferred with pt.

## 2015-11-02 NOTE — Tx Team (Signed)
Initial Interdisciplinary Treatment Plan   PATIENT STRESSORS: Financial difficulties Medication change or noncompliance Occupational concerns   PATIENT STRENGTHS: Ability for insight Capable of independent living General fund of knowledge Motivation for treatment/growth Supportive family/friends   PROBLEM LIST: Problem List/Patient Goals Date to be addressed Date deferred Reason deferred Estimated date of resolution  depression 11/02/2015     anxiety 11/02/2015     Suicidal ideation 11/02/2015     Medication noncompliance 11/02/2015     "start on medication to help with my depression" 11/02/2015                              DISCHARGE CRITERIA:  Adequate post-discharge living arrangements Improved stabilization in mood, thinking, and/or behavior Motivation to continue treatment in a less acute level of care  PRELIMINARY DISCHARGE PLAN: Attend PHP/IOP Outpatient therapy Participate in family therapy Return to previous living arrangement Return to previous work or school arrangements  PATIENT/FAMIILY INVOLVEMENT: This treatment plan has been presented to and reviewed with the patient, Pam Graham,  The patient and family have been given the opportunity to ask questions and make suggestions.  [name], [name] 11/02/2015, 2:16 AM

## 2015-11-02 NOTE — Plan of Care (Signed)
Problem: Alteration in mood Goal: LTG-Patient reports reduction in suicidal thoughts (Patient reports reduction in suicidal thoughts and is able to verbalize a safety plan for whenever patient is feeling suicidal)  Outcome: Progressing Pt denies SI and verbally contracts for safety

## 2015-11-02 NOTE — BHH Group Notes (Signed)
[place] LCSW Group Therapy 11/02/2015  1:15 PM Type of Therapy: Group Therapy Participation Level: Minimal  Participation Quality: Attentive  Affect: Blunted  Cognitive: Alert and Oriented  Insight: Developing/Improving and Engaged  Engagement in Therapy: Developing/Improving and Engaged  Modes of Intervention: Clarification, Confrontation, Discussion, Education, Exploration, Limit-setting, Orientation, Problem-solving, Rapport Building, Art therapist, Socialization and Support  Summary of Progress/Problems: The topic for group today was emotional regulation. This group focused on both positive and negative emotion identification and allowed group members to process ways to identify feelings, regulate negative emotions, and find healthy ways to manage internal/external emotions. Group members were asked to reflect on a time when their reaction to an emotion led to a negative outcome and explored how alternative responses using emotion regulation would have benefited them. Group members were also asked to discuss a time when emotion regulation was utilized when a negative emotion was experienced. Patient participated minimally in discussion despite CSW encouragement.   Tilden Fossa, MSW, [place] Clinical Social Worker [facility] [phone]

## 2015-11-02 NOTE — BHH Suicide Risk Assessment (Signed)
[facility] Admission Suicide Risk Assessment   Nursing information obtained from:   patient and chart  Demographic factors:   45 year old female , divorced , no children Current Mental Status:   see below  Loss Factors:   divorce, broken relationships, financial difficulties, recently moved out of mother's house  Historical Factors:   depression, anxiety  Risk Reduction Factors:   resilience   Total Time spent with patient: 45 minutes Principal Problem: MDD  Diagnosis:   Patient Active Problem List   Diagnosis Date Noted  . MDD (major depressive disorder), recurrent severe, without psychosis ([place]) [F33.2] 11/02/2015  . SYNCOPE [R55] 02/15/2010  . POLYCYSTIC OVARIAN DISEASE [E28.2] 02/14/2010  . GRAND MAL SEIZURE [id number] 02/14/2010  . PAC [I49.1] 02/14/2010  . PREMATURE VENTRICULAR CONTRACTIONS [I49.49] 02/14/2010  . OTHER PSORIASIS AND SIMILAR DISORDERS [L40.8] 02/14/2010  . DEAFNESS, CONGENITAL [Q16.9] 02/14/2010  . HEART MURMUR, BENIGN [R01.1] 02/14/2010     Continued Clinical Symptoms:  Alcohol Use Disorder Identification Test Final Score (AUDIT): 1 The "Alcohol Use Disorders Identification Test", Guidelines for Use in Primary Care, Second Edition.  World Pharmacologist [facility]). Score between 0-7:  no or low risk or alcohol related problems. Score between 8-15:  moderate risk of alcohol related problems. Score between 16-19:  high risk of alcohol related problems. Score 20 or above:  warrants further diagnostic evaluation for alcohol dependence and treatment.   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 Describes series of losses, stressors, to include death of father, divorce, failed relationship, financial issues      Psychiatric Specialty Exam: ROS  Blood pressure 137/102, pulse 85, temperature 98.2 F (36.8 C), temperature source Oral, resp. rate 20,  height 5\' 6"  (1.676 m), weight 172 lb (78.019 kg).Body mass index is 27.77 kg/(m^2).   see admit note MSE                                                       COGNITIVE FEATURES THAT CONTRIBUTE TO RISK:  Closed-mindedness and Loss of executive function    SUICIDE RISK:   Moderate:  Frequent suicidal ideation with limited intensity, and duration, some specificity in terms of plans, no associated intent, good self-control, limited dysphoria/symptomatology, some risk factors present, and identifiable protective factors, including available and accessible social support.  PLAN OF CARE: Patient will be admitted to inpatient psychiatric unit for stabilization and safety. Will provide and encourage milieu participation. Provide medication management and maked adjustments as needed.  Will follow daily.    I certify that inpatient services furnished can reasonably be expected to improve the patient's condition.   Neita Garnet, MD 11/02/2015, 3:36 PM

## 2015-11-02 NOTE — Tx Team (Addendum)
Interdisciplinary Treatment Plan Update (Adult) Date: 11/02/2015    Time Reviewed: 9:30 AM  Progress in Treatment: Attending groups: Yes Participating in groups: Minimally Taking medication as prescribed: Yes Tolerating medication: Yes Family/Significant other contact made: Yes, CSW has spoken with patient's mother Patient understands diagnosis: Yes Discussing patient identified problems/goals with staff: Yes Medical problems stabilized or resolved: Yes Denies suicidal/homicidal ideation: Yes Issues/concerns per patient self-inventory: Yes Other:  New problem(s) identified: N/A  Discharge Plan or Barriers: Home with outpatient  Reason for Continuation of Hospitalization:  Depression Anxiety Medication Stabilization   Comments: N/A  Estimated length of stay: Discharge anticipated for 11/05/15    Patient is a 45 year old female who presented to the hospital with depression, anxiety, and SI. Pt reports primary trigger(s) for admission was relationship, financial, and employment stressors. Patient will benefit from crisis stabilization, medication evaluation, group therapy and psycho education in addition to case management for discharge planning. At discharge, it is recommended that Pt remain compliant with established discharge plan and continued treatment.   Review of initial/current patient goals per problem list:  1. Goal(s): Patient will participate in aftercare plan   Met: Yes   Target date: 3-5 days post admission date   As evidenced by: Patient will participate within aftercare plan AEB aftercare provider and housing plan at discharge being identified.  5/10: Goal not met: CSW assessing for appropriate referrals for pt and will have follow up secured prior to d/c.  5/12: Goal met. Home with outpatient   2. Goal (s): Patient will exhibit decreased depressive symptoms and suicidal ideations.   Met: Yes   Target date: 3-5 days post admission date   As  evidenced by: Patient will utilize self rating of depression at 3 or below and demonstrate decreased signs of depression or be deemed stable for discharge by MD.  5/10: Goal not met: Pt presents with flat affect and depressed mood.  Pt admitted with depression rating of 10.  Pt to show decreased sign of depression and a rating of 3 or less before d/c.    5/12: Goal met. Patient rates depression at 3, denies SI.    3. Goal(s): Patient will demonstrate decreased signs and symptoms of anxiety.   Met: Yes   Target date: 3-5 days post admission date   As evidenced by: Patient will utilize self rating of anxiety at 3 or below and demonstrated decreased signs of anxiety, or be deemed stable for discharge by MD  5/10: Goal not met: Pt presents with anxious mood and affect.  Pt admitted with anxiety rating of 10.  Pt to show decreased sign of anxiety and a rating of 3 or less before d/c.  5/12: Goal met. Rates anxiety at 3.   Attendees: Patient:    Family:    Physician: Dr. Parke Poisson 11/02/2015 9:30 AM  Nursing: Loletta Specter, Darrol Angel, RN 11/02/2015 9:30 AM  Clinical Social Worker: Tilden Fossa, LCSW 11/02/2015 9:30 AM  Other: Maxie Better, LCSW  11/02/2015 9:30 AM  Other:  11/02/2015 9:30 AM  Other: Lars Pinks, Case Manager 11/02/2015 9:30 AM  Other: Andria Rhein, NP 11/02/2015 9:30 AM  Other:    Other:    Other:    Other:     Scribe for Treatment Team:  Tilden Fossa, [place]

## 2015-11-02 NOTE — Progress Notes (Signed)
Recreation Therapy Notes  Date: 05.10.2017 Time: 9:30am Location: [address] Dayroom  Group Topic: Stress Management  Goal Area(s) Addresses:  Patient will actively participate in stress management techniques presented during session.   Behavioral Response: Did not attend.   Laureen Ochs [place] Grieshop, LRT/CTRS         Mihail Prettyman L 11/02/2015 12:11 PM

## 2015-11-02 NOTE — Progress Notes (Signed)
Adult Psychoeducational Group Note  Date:  11/02/2015 Time:  9:21 PM  Group Topic/Focus:  Wrap-Up Group:   The focus of this group is to help patients review their daily goal of treatment and discuss progress on daily workbooks.  Participation Level:  Active  Participation Quality:  Appropriate and Attentive  Affect:  Appropriate  Cognitive:  Appropriate  Insight: Appropriate and Good  Engagement in Group:  Engaged  Modes of Intervention:  Discussion  Additional Comments:  Pt rated her day a 6 out of 10. Pt mentioned she is a little restless. Pt goal for tomorrow is to attend all groups.   Jerline Pain 11/02/2015, 9:21 PM

## 2015-11-02 NOTE — Progress Notes (Signed)
Patient ID: Pam Graham, female   DOB: Aug 13, 1970, 45 y.o.   MRN: [id number] Admission note: D:Patient is a  Voluntary admission in no acute distress for depression, anxiety, and SI with no particular plan. Pt reports stopped taken her medication a year ago because it was not effective. Pt reports her father passed away and not long after she had to file for divorce. Pt reports job and relationship changes as stressor. Pt denies SI/AVH and pain. Pt crying, feels overwhelm wants medication stabilizatiom A: Pt admitted to unit per protocol, skin assessment and belonging search done. No skin issues noted. Consent signed by pt. Pt educated on therapeutic milieu rules. Pt was introduced to milieu by nursing staff. Fall risk safety plan explained to the patient. 15 minutes checks started for safety. R: Pt was receptive to education. Writer offered support.

## 2015-11-02 NOTE — H&P (Signed)
Psychiatric Admission Assessment Adult  Patient Identification: Pam Graham MRN:  [id number] Date of Evaluation:  11/02/2015 Chief Complaint:   " I have had a lot of losses " Principal Diagnosis: MDD  Diagnosis:   Patient Active Problem List   Diagnosis Date Noted  . MDD (major depressive disorder), recurrent severe, without psychosis ([place]) [F33.2] 11/02/2015  . SYNCOPE [R55] 02/15/2010  . POLYCYSTIC OVARIAN DISEASE [E28.2] 02/14/2010  . GRAND MAL SEIZURE [id number] 02/14/2010  . PAC [I49.1] 02/14/2010  . PREMATURE VENTRICULAR CONTRACTIONS [I49.49] 02/14/2010  . OTHER PSORIASIS AND SIMILAR DISORDERS [L40.8] 02/14/2010  . DEAFNESS, CONGENITAL [Q16.9] 02/14/2010  . HEART MURMUR, BENIGN [R01.1] 02/14/2010   History of Present Illness:: 45 year old female, states she has a history of depression and anxiety.  She reports worsening depression in the context of " an accumulation of losses and stressors "  Presented to the ED voluntarily due to worsening depression, anxiety, and passive SI, but denies any actual plan or intention of hurting self  Reports she has faced significant losses , father died in 10-11-11, and separated from husband around the same time, divorce was finalized in early 2013/10/10. Since then, she  has had some recent relationships which have not been healthy for her or did not work out.   She states she was in a relationship with a married man, that ended, but she still " obsesses " about it . She lost her job in October of 2016, possibly related to calling out too often, and states she has had some financial difficulties . She states she had been doing a little better while she was living with her mother, but recently moved out, now living independently, and " it seemed to bring everything back".  Associated Signs/Symptoms: Depression Symptoms:  depressed mood, insomnia, recurrent thoughts of death, suicidal thoughts without plan, anxiety, loss of energy/fatigue, decreased  appetite, (Hypo) Manic Symptoms:  Denies  Anxiety Symptoms:  States she has excessive worry, subjective feelings of free floating anxiety, denies panic, denies agoraphobia  Psychotic Symptoms:  Denies  PTSD Symptoms: Denies   Total Time spent with patient: 45 minutes  Past Psychiatric History:  This is her first psychiatric admission, has never attempted suicide, denies history of self cutting, denies history of violence,  Denies history of mania, denies history of psychosis, denies history of OCD , was diagnosed with Anorexia as a teenager, but states she doubts she had it.   Is the patient at risk to self? Yes.    Has the patient been a risk to self in the past 6 months? No.  Has the patient been a risk to self within the distant past? No.  Is the patient a risk to others? No.  Has the patient been a risk to others in the past 6 months? No.  Has the patient been a risk to others within the distant past? No.   Prior Inpatient Therapy:  no  Prior Outpatient Therapy:  sees Dr. Rica Mote at [facility]  Alcohol Screening: 1. How often do you have a drink containing alcohol?: Monthly or less 2. How many drinks containing alcohol do you have on a typical day when you are drinking?: 1 or 2 3. How often do you have six or more drinks on one occasion?: Never Preliminary Score: 0 9. Have you or someone else been injured as a result of your drinking?: No 10. Has a relative or friend or a doctor or another health worker been concerned about your drinking or  suggested you cut down?: No Alcohol Use Disorder Identification Test Final Score (AUDIT): 1 Brief Intervention: AUDIT score less than 7 or less-screening does not suggest unhealthy drinking-brief intervention not indicated Substance Abuse History in the last 12 months: denies alcohol or drug abuse  Consequences of Substance Abuse: Denies  Previous Psychotropic Medications:  She has been on Effexor XR, Zoloft in the past /not recently , unsure if  they helped . Started on Prozac one to two days ago Psychological Evaluations:  No  Past Medical History:   Past Medical History  Diagnosis Date  . Ovarian cyst   . PCOS (polycystic ovarian syndrome)   . Erythrodermic psoriasis   . Seizure disorder ([place])   . Hypertension   . Fatty liver 12/26/11  . PAC (premature atrial contraction) 04/02/08  . PVC (premature ventricular contraction) 04/02/08  . Deafness in right ear     congenital  . Anal fissure   . IBS (irritable bowel syndrome)   . Anal fissure   . Tubular adenoma of colon   . Depression   . Anxiety     Past Surgical History  Procedure Laterality Date  . Cystoscopy    . Liposuction    . Abdominoplasty    . Colonoscopy     Family History:  Family History  Problem Relation Age of Onset  . Anesthesia problems Neg Hx   . Colon cancer Paternal Grandmother   . Colon polyps Father   . Colon polyps Paternal Aunt   . Heart disease Maternal Grandfather   . Heart disease      uncle  . Diabetes      aunt  . Diabetes      uncle  . Alcoholism Maternal Grandfather    Family Psychiatric  History:  Tobacco Screening: does not smoke Social History: Therapist, sports, working from home , divorced , no children, lives alone but recently had been living with mother- states she returned to live independently only a few days ago. Describes financial and relationship stressors. History  Alcohol Use  . Yes    Comment: socially     History  Drug Use No    Additional Social History:  Allergies:   Allergies  Allergen Reactions  . Iohexol      Desc: HIVES,ICHINGING,NEEDS PRE.MEDS   . Penicillins Hives and Itching   Lab Results:  Results for orders placed or performed during the hospital encounter of 11/02/15 (from the past 48 hour(s))  Lipid panel, fasting     Status: Abnormal   Collection Time: 11/02/15  6:15 AM  Result Value Ref Range   Cholesterol 208 (H) 0 - 200 mg/dL   Triglycerides 115 <150 mg/dL   HDL 39 (L) >40 mg/dL   Total  CHOL/HDL Ratio 5.3 RATIO   VLDL 23 0 - 40 mg/dL   LDL Cholesterol 146 (H) 0 - 99 mg/dL    Comment:        Total Cholesterol/HDL:CHD Risk Coronary Heart Disease Risk Table                     Men   Women  1/2 Average Risk   3.4   3.3  Average Risk       5.0   4.4  2 X Average Risk   9.6   7.1  3 X Average Risk  23.4   11.0        Use the calculated Patient Ratio above and the CHD Risk Table to determine the  patient's CHD Risk.        ATP III CLASSIFICATION (LDL):  <100     mg/dL   Optimal  100-129  mg/dL   Near or Above                    Optimal  130-159  mg/dL   Borderline  160-189  mg/dL   High  >190     mg/dL   Very High Performed at [facility]   TSH     Status: None   Collection Time: 11/02/15  6:15 AM  Result Value Ref Range   TSH 4.166 0.350 - 4.500 uIU/mL    Comment: Performed at [facility]    Blood Alcohol level:  Lab Results  Component Value Date   [facility] <5 [id number]    Metabolic Disorder Labs:  No results found for: HGBA1C, MPG No results found for: PROLACTIN Lab Results  Component Value Date   CHOL 208* 11/02/2015   TRIG 115 11/02/2015   HDL 39* 11/02/2015   CHOLHDL 5.3 11/02/2015   VLDL 23 11/02/2015   LDLCALC 146* 11/02/2015    Current Medications: Current Facility-Administered Medications  Medication Dose Route Frequency Provider Last Rate Last Dose  . acetaminophen (TYLENOL) tablet 650 mg  650 mg Oral Q6H PRN Laverle Hobby, PA-C      . alum & mag hydroxide-simeth (MAALOX/MYLANTA) 200-200-20 MG/5ML suspension 30 mL  30 mL Oral Q4H PRN Laverle Hobby, PA-C      . feeding supplement (ENSURE ENLIVE) (ENSURE ENLIVE) liquid 237 mL  237 mL Oral BID BM Myer Peer Cobos, MD   237 mL at 11/02/15 1158  . FLUoxetine (PROZAC) capsule 20 mg  20 mg Oral Daily Laverle Hobby, PA-C   20 mg at 11/02/15 0813  . hydrOXYzine (ATARAX/VISTARIL) tablet 25 mg  25 mg Oral Q6H PRN Laverle Hobby, PA-C   25 mg at 11/02/15 0813  .  ibuprofen (ADVIL,MOTRIN) tablet 400 mg  400 mg Oral Q6H PRN Laverle Hobby, PA-C      . levETIRAcetam (KEPPRA XR) 24 hr tablet 1,000 mg  1,000 mg Oral QHS Maurine Minister Simon, PA-C   1,000 mg at 11/02/15 0200  . loratadine (CLARITIN) tablet 10 mg  10 mg Oral Daily Laverle Hobby, PA-C   10 mg at 11/02/15 0813  . magnesium hydroxide (MILK OF MAGNESIA) suspension 30 mL  30 mL Oral Daily PRN Laverle Hobby, PA-C      . norethindrone-ethinyl estradiol-iron (MICROGESTIN FE,GILDESS FE,LOESTRIN FE) 1.5-30 MG-MCG tablet 1 tablet  1 tablet Oral QHS Laverle Hobby, PA-C   1 tablet at 11/02/15 0200  . traZODone (DESYREL) tablet 50 mg  50 mg Oral QHS,MR X 1 Spencer E Simon, PA-C   50 mg at 11/02/15 0139   PTA Medications: Prescriptions prior to admission  Medication Sig Dispense Refill Last Dose  . cetirizine (ZYRTEC) 10 MG tablet Take 10 mg by mouth daily.   Past Week at Unknown time  . levETIRAcetam (KEPPRA XR) 500 MG 24 hr tablet Take 1,000 mg by mouth at bedtime.   11/01/2015 at Unknown time  . norethindrone-ethinyl estradiol-iron (MICROGESTIN FE,GILDESS FE,LOESTRIN FE) 1.5-30 MG-MCG tablet Take 1 tablet by mouth at bedtime.   11/01/2015 at Unknown time  . ibuprofen (ADVIL,MOTRIN) 200 MG tablet Take 400 mg by mouth every 6 (six) hours as needed for moderate pain.   Past Week at Unknown time  . Ketotifen Fumarate (ALLERGY EYE  DROPS OP) Apply 1 drop to eye daily as needed (allergies).   Past Week at Unknown time    Musculoskeletal: Strength & Muscle Tone: within normal limits Gait & Station: normal Patient leans: N/A  Psychiatric Specialty Exam: Physical Exam  Review of Systems  Constitutional: Negative.   HENT: Negative.   Eyes: Negative.   Respiratory: Negative.   Cardiovascular: Negative.   Gastrointestinal: Negative.   Genitourinary: Negative.   Musculoskeletal: Negative.   Skin: Negative.   Neurological: Positive for seizures.       Last seizure about 10  years ago  Endo/Heme/Allergies:  Negative.   Psychiatric/Behavioral: Positive for depression and suicidal ideas.    Blood pressure 137/102, pulse 85, temperature 98.2 F (36.8 C), temperature source Oral, resp. rate 20, height 5\' 6"  (1.676 m), weight 172 lb (78.019 kg).Body mass index is 27.77 kg/(m^2).  General Appearance: Well Groomed  Engineer, water::  Good  Speech:  Normal Rate  Volume:  Normal  Mood:  Anxious and Depressed  Affect:  constricted but does smile at times appropriately   Thought Process:  Linear  Orientation:  Full (Time, Place, and Person)  Thought Content:  no hallucinations, no delusions , not internally preoccupied   Suicidal Thoughts:  No denies any current suicidal plan or intention , contracts for safety on the unit   Homicidal Thoughts:  No denies any homicidal ideations   Memory:  recent and remote grossly intact   Judgement:  Fair  Insight:  Fair  Psychomotor Activity:  Normal  Concentration:  Good  Recall:  Good  Fund of Knowledge:Good  Language: Good  Akathisia:  Negative  Handed:  Right  AIMS (if indicated):     Assets:  Communication Skills Desire for Improvement Resilience  ADL's:  Intact  Cognition: WNL  Sleep:  Number of Hours: 3.75 (late admission)     Treatment Plan Summary: Daily contact with patient to assess and evaluate symptoms and progress in treatment, Medication management, Plan inpatient admission and medications as below   Observation Level/Precautions:  15 minute checks  Laboratory:  as needed   Psychotherapy:  Milieu, support   Medications: Prozac  For depression, and continue Keppra for seizure disorder   Consultations: as needed    Discharge Concerns:  -   Estimated LOS: 5 days   Other:     I certify that inpatient services furnished can reasonably be expected to improve the patient's condition.    Neita Garnet, MD 5/10/20173:05 PM

## 2015-11-02 NOTE — Progress Notes (Signed)
D: Pt presented sad and tearful on approach this morning. Pt reported feeling increasingly anxious due to not knowing what to expect. Writer explained to pt the expectations of the unit and the unit schedule. Pt requested prn Vistaril for anxiety. Med given at pt request. Pt rates depression 7/10. Anxiety 8/10. Hopeless 7/10. Pt denies suicidal thoughts and verbally contracts for safety. Pt verbalized that she needs tx for her anxiety.  A: Medications administered as ordered per MD. Verbal support provided. Pt encouraged to attend groups. 15 minute checks performed for safety. R: Pt stated goal "work on anxiety". Pt receptive to tx.

## 2015-11-03 LAB — PROLACTIN: PROLACTIN: 59 ng/mL — AB (ref 4.8–23.3)

## 2015-11-03 LAB — HEMOGLOBIN A1C
HEMOGLOBIN A1C: 5.7 % — AB (ref 4.8–5.6)
MEAN PLASMA GLUCOSE: 117 mg/dL

## 2015-11-03 NOTE — Progress Notes (Signed)
D: Pt has appropriate affect and pleasant mood.  She reports her day was "really good" and her goal was "to attend groups and I did."  Pt reports she had a good visit with her brother and sister-in-law tonight.  Pt denies SI/HI, denies hallucinations, denies pain.  Pt has been visible in milieu interacting with peers and staff appropriately.  Pt attended evening group.   A:  Met with pt and offered support and encouragement.  Actively listened to pt.  Medications administered per order.  PRN medication administered for sleep. R: Pt is compliant with medications.  Pt verbally contracts for safety.  Will continue to monitor and assess.

## 2015-11-03 NOTE — Progress Notes (Signed)
D: Patient reports she is sleeping and eating well.  She rates her depression as a 5; hopelessness as a 4; anxiety as a 6.  She requested vistaril for anxiety this morning.  She is attending groups and participating.  She continues to report poor concentration.  She states her depressive symptoms have decreased.  She denies SI/HI/AVH. A: Continue to monitor medication management and MD orders.  Safety checks completed every 15 minutes per protocol.  Offer support and encouragement as needed. R: Patient is receptive to staff; her behavior is appropriate.

## 2015-11-03 NOTE — BHH Group Notes (Signed)
[facility] Group Therapy 11/03/2015 1:15pm  Type of Therapy: Mental [facility] Association Presentation  Participation Level: Active  Participation Quality: Attentive  Affect: Appropriate  Cognitive: Oriented  Insight: Developing/Improving  Engagement in Therapy: Engaged  Modes of Intervention: Discussion, Education and Socialization  Summary of Progress/Problems: [facility] Association ([place]) Speaker came to talk about his personal journey with substance abuse and addiction. The pt processed ways by which to relate to the speaker. [place] speaker provided handouts and educational information pertaining to groups and services offered by the [facility]. Pt was engaged in speaker's presentation and was receptive to resources provided.    Peri Maris, [facility] 11/03/2015 1:41 PM

## 2015-11-03 NOTE — Progress Notes (Signed)
[facility] MD Progress Note  11/03/2015 1:53 PM Pam Graham  MRN:  [id number] Subjective:   Reports partial improvement  Of her mood and states she is feeling better than prior to admission. At this time denies medication side effects. Objective: I have discussed case with treatment team and have met with patient. As per staff , patient has been active in groups, pleasant on approach, motivated in treatment . Patient denies medication side effects at this time and is tolerating Prozac trial well thus far. She denies current suicidal ideations, and expresses motivation in further improvement . Patient insightful and making connections between childhood experiences, particularly regarding her relationship with parents/father, and behavioral patterns as adult regarding getting into relationships she realizes are not healthy for her. Patient will likely benefit significantly from ongoing individual psychotherapy after discharge. Affect improves with support, empathy.   Principal Problem: MDD (major depressive disorder), recurrent severe, without psychosis ([place]) Diagnosis:   Patient Active Problem List   Diagnosis Date Noted  . MDD (major depressive disorder), recurrent severe, without psychosis ([place]) [F33.2] 11/02/2015  . SYNCOPE [R55] 02/15/2010  . POLYCYSTIC OVARIAN DISEASE [E28.2] 02/14/2010  . GRAND MAL SEIZURE [K35.465] 02/14/2010  . PAC [I49.1] 02/14/2010  . PREMATURE VENTRICULAR CONTRACTIONS [I49.49] 02/14/2010  . OTHER PSORIASIS AND SIMILAR DISORDERS [L40.8] 02/14/2010  . DEAFNESS, CONGENITAL [Q16.9] 02/14/2010  . HEART MURMUR, BENIGN [R01.1] 02/14/2010   Total Time spent with patient: 25 minutes     Past Medical History:  Past Medical History  Diagnosis Date  . Ovarian cyst   . PCOS (polycystic ovarian syndrome)   . Erythrodermic psoriasis   . Seizure disorder ([place])   . Hypertension   . Fatty liver 12/26/11  . PAC (premature atrial contraction) 04/02/08  . PVC (premature  ventricular contraction) 04/02/08  . Deafness in right ear     congenital  . Anal fissure   . IBS (irritable bowel syndrome)   . Anal fissure   . Tubular adenoma of colon   . Depression   . Anxiety     Past Surgical History  Procedure Laterality Date  . Cystoscopy    . Liposuction    . Abdominoplasty    . Colonoscopy     Family History:  Family History  Problem Relation Age of Onset  . Anesthesia problems Neg Hx   . Colon cancer Paternal Grandmother   . Colon polyps Father   . Colon polyps Paternal Aunt   . Heart disease Maternal Grandfather   . Heart disease      uncle  . Diabetes      aunt  . Diabetes      uncle  . Alcoholism Maternal Grandfather     Social History:  History  Alcohol Use  . Yes    Comment: socially     History  Drug Use No    Social History   Social History  . Marital Status: Married    Spouse Name: N/A  . Number of Children: N/A  . Years of Education: N/A   Social History Main Topics  . Smoking status: Never Smoker   . Smokeless tobacco: Never Used  . Alcohol Use: Yes     Comment: socially  . Drug Use: No  . Sexual Activity: Yes    Birth Control/ Protection: Pill   Other Topics Concern  . None   Social History Narrative   Additional Social History:   Sleep: Good  Appetite:  Good  Current Medications: Current Facility-Administered Medications  Medication Dose Route Frequency Provider Last Rate Last Dose  . acetaminophen (TYLENOL) tablet 650 mg  650 mg Oral Q6H PRN Laverle Hobby, PA-C      . alum & mag hydroxide-simeth (MAALOX/MYLANTA) 200-200-20 MG/5ML suspension 30 mL  30 mL Oral Q4H PRN Laverle Hobby, PA-C      . feeding supplement (ENSURE ENLIVE) (ENSURE ENLIVE) liquid 237 mL  237 mL Oral BID BM Myer Peer Terrisha Lopata, MD   237 mL at 11/02/15 1629  . FLUoxetine (PROZAC) capsule 20 mg  20 mg Oral Daily Laverle Hobby, PA-C   20 mg at 11/03/15 0815  . hydrOXYzine (ATARAX/VISTARIL) tablet 25 mg  25 mg Oral Q6H PRN Laverle Hobby, PA-C   25 mg at 11/03/15 0816  . ibuprofen (ADVIL,MOTRIN) tablet 400 mg  400 mg Oral Q6H PRN Laverle Hobby, PA-C      . levETIRAcetam (KEPPRA XR) 24 hr tablet 1,000 mg  1,000 mg Oral QHS Laverle Hobby, PA-C   1,000 mg at 11/02/15 2119  . loratadine (CLARITIN) tablet 10 mg  10 mg Oral Daily Laverle Hobby, PA-C   10 mg at 11/03/15 0815  . magnesium hydroxide (MILK OF MAGNESIA) suspension 30 mL  30 mL Oral Daily PRN Laverle Hobby, PA-C      . norethindrone-ethinyl estradiol-iron (MICROGESTIN FE,GILDESS FE,LOESTRIN FE) 1.5-30 MG-MCG tablet 1 tablet  1 tablet Oral QHS Laverle Hobby, PA-C   1 tablet at 11/02/15 2120  . traZODone (DESYREL) tablet 50 mg  50 mg Oral QHS PRN Jenne Campus, MD   50 mg at 11/02/15 2119    Lab Results:  Results for orders placed or performed during the hospital encounter of 11/02/15 (from the past 48 hour(s))  Hemoglobin A1c     Status: Abnormal   Collection Time: 11/02/15  6:15 AM  Result Value Ref Range   Hgb A1c MFr Bld 5.7 (H) 4.8 - 5.6 %    Comment: (NOTE)         Pre-diabetes: 5.7 - 6.4         Diabetes: >6.4         Glycemic control for adults with diabetes: <7.0    Mean Plasma Glucose 117 mg/dL    Comment: (NOTE) Performed At: [facility] [place], [place] [id number] Lindon Romp MD [id number] Performed at [facility]   Lipid panel, fasting     Status: Abnormal   Collection Time: 11/02/15  6:15 AM  Result Value Ref Range   Cholesterol 208 (H) 0 - 200 mg/dL   Triglycerides 115 <150 mg/dL   HDL 39 (L) >40 mg/dL   Total CHOL/HDL Ratio 5.3 RATIO   VLDL 23 0 - 40 mg/dL   LDL Cholesterol 146 (H) 0 - 99 mg/dL    Comment:        Total Cholesterol/HDL:CHD Risk Coronary Heart Disease Risk Table                     Men   Women  1/2 Average Risk   3.4   3.3  Average Risk       5.0   4.4  2 X Average Risk   9.6   7.1  3 X Average Risk  23.4   11.0        Use the calculated Patient  Ratio above and the CHD Risk Table to determine the patient's CHD Risk.  ATP III CLASSIFICATION (LDL):  <100     mg/dL   Optimal  100-129  mg/dL   Near or Above                    Optimal  130-159  mg/dL   Borderline  160-189  mg/dL   High  >190     mg/dL   Very High Performed at [facility]   TSH     Status: None   Collection Time: 11/02/15  6:15 AM  Result Value Ref Range   TSH 4.166 0.350 - 4.500 uIU/mL    Comment: Performed at [facility]  Prolactin     Status: Abnormal   Collection Time: 11/02/15  6:15 AM  Result Value Ref Range   Prolactin 59.0 (H) 4.8 - 23.3 ng/mL    Comment: (NOTE) Performed At: [facility] [place], [place] [id number] Lindon Romp MD [id number] Performed at [facility]     Blood Alcohol level:  Lab Results  Component Value Date   [facility] <5 11/01/2015    Physical Findings: AIMS: Facial and Oral Movements Muscles of Facial Expression: None, normal Lips and Perioral Area: None, normal Jaw: None, normal Tongue: None, normal,Extremity Movements Upper (arms, wrists, hands, fingers): None, normal Lower (legs, knees, ankles, toes): None, normal, Trunk Movements Neck, shoulders, hips: None, normal, Overall Severity Severity of abnormal movements (highest score from questions above): None, normal Incapacitation due to abnormal movements: None, normal Patient's awareness of abnormal movements (rate only patient's report): No Awareness, Dental Status Current problems with teeth and/or dentures?: No Does patient usually wear dentures?: No  CIWA:    COWS:     Musculoskeletal: Strength & Muscle Tone: within normal limits Gait & Station: normal Patient leans: N/A  Psychiatric Specialty Exam: ROS no headache, no chest pain, no vomiting   Blood pressure 118/82, pulse 88, temperature 98.7 F (37.1 C), temperature source Oral, resp. rate 17, height '5\' 6"'  (1.676 m), weight  172 lb (78.019 kg).Body mass index is 27.77 kg/(m^2).  General Appearance: Well Groomed  Engineer, water::  Good  Speech:  Normal Rate  Volume:  Normal  Mood:  less depressed  Affect:  more reactive, but still tends to be constricted, anxious, does smile appropriately at times   Thought Process:  Linear  Orientation:  Full (Time, Place, and Person)  Thought Content:  denies hallucinations, no delusions   Suicidal Thoughts:  No denies any current suicidal ideations, denies any self injurious ideations   Homicidal Thoughts:  No  Memory:  recent and remote grossly intact   Judgement:   Improving   Insight:  improving   Psychomotor Activity:  Normal  Concentration:  Good  Recall:  Good  Fund of Knowledge:Good  Language: Good  Akathisia:  Negative  Handed:  Right  AIMS (if indicated):     Assets:  Communication Skills Desire for Improvement Resilience  ADL's:  Intact  Cognition: WNL  Sleep:  Number of Hours: 6.75  Assessment - patient's mood, affect improving . No current SI. Thus far tolerating medications well ( Prozac trial) . On Keppra for history of seizure disorder, denies side effects- no seizure activity on unit, states last seizure was years ago. Treatment Plan Summary: Daily contact with patient to assess and evaluate symptoms and progress in treatment, Medication management, Plan inpatient treatment  and medications as below  Encourage ongoing group and milieu participation to work on coping skills and symptom  reduction Continue Prozac 20 mgrs QDAY for depression and anxiety Continue Vistaril 25 mgrs Q 6 hours PRN for anxiety as needed  Continue Trazodone 50 mgrs QHS PRN for insomnia as needed  Continue Keppra 1000 mgrs QHS for history of seizure disorder  Treatment team working on disposition planning  Neita Garnet, MD 11/03/2015, 1:53 PM

## 2015-11-03 NOTE — BHH Group Notes (Signed)
The focus of this group is to educate the patient on the purpose and policies of crisis stabilization and provide a format to answer questions about their admission.  The group details unit policies and expectations of patients while admitted.  Patient attended 0900 nurse education orientation group this morning.  Patient actively participated and had appropriate affect.  Patient is alert.  Patient has appropriate insight and appropriate engagement.  Today patient will work on 3 goals for discharge.    

## 2015-11-03 NOTE — BHH Suicide Risk Assessment (Signed)
[place] INPATIENT:  Family/Significant Other Suicide Prevention Education  Suicide Prevention Education:  Education Completed; Clarie Hedrich, Pt's mother [phone], has been identified by the patient as the family member/significant other with whom the patient will be residing, and identified as the person(s) who will aid the patient in the event of a mental health crisis (suicidal ideations/suicide attempt).  With written consent from the patient, the family member/significant other has been provided the following suicide prevention education, prior to the and/or following the discharge of the patient.  The suicide prevention education provided includes the following:  Suicide risk factors  Suicide prevention and interventions  National Suicide Hotline telephone number  [facility] assessment telephone number  [facility] Emergency Assistance [place] and/or Residential Mobile Crisis Unit telephone number  Request made of family/significant other to:  Remove weapons (e.g., guns, rifles, knives), all items previously/currently identified as safety concern.    Remove drugs/medications (over-the-counter, prescriptions, illicit drugs), all items previously/currently identified as a safety concern.  The family member/significant other verbalizes understanding of the suicide prevention education information provided.  The family member/significant other agrees to remove the items of safety concern listed above.  Bo Mcclintock 11/03/2015, 3:24 PM

## 2015-11-03 NOTE — Progress Notes (Signed)
Pt attend music therapy this evening.

## 2015-11-04 NOTE — Plan of Care (Signed)
Problem: Diagnosis: Increased Risk For Suicide Attempt Goal: STG-Patient Will Comply With Medication Regime Outcome: Progressing Pt has been compliant with medications tonight.

## 2015-11-04 NOTE — Progress Notes (Signed)
Pam Graham remains tearful, sad and depressed. When this nurse approached her this morning, she responded " I'm ok" . She does complete her daily assessment and on it she wrote she denied SI today and she rated her depression, hopelessness anda anxiety " 3/3/3", respectively. A She requested and was given a prn vistaril today, for c/o anxiety and stated relief afterwards. She said she has ' always been anxious". R Safety is in place.

## 2015-11-04 NOTE — Progress Notes (Signed)
  [facility] Adult Case Management Discharge Plan :  Will you be returning to the same living situation after discharge:  Yes,  patient plans ot return home At discharge, do you have transportation home?: Yes,  friend/family Do you have the ability to pay for your medications: Yes,  patient will be provided with prescriptions at discharge  Release of information consent forms completed and in the chart;  Patient's signature needed at discharge.  Patient to Follow up at: Follow-up Information    Follow up with [facility].   Why:  on 5/17 at 6:00pm with [name] for therapy. Please call the day of your appointment to have an appointment scheduled with a psychiatrist or PA for med management. Please ask for an appointment within 30 days of your discharge    Contact information:   [place], [place] [address]   AT THIS LOCATION ONLY UNTIL Nov 04, 2015 MOVING ON Nov 07, 2015 TO: [place], [place]  [address]   Phone: [phone]  Fax: [phone]      Next level of care provider has access to [place] and Suicide Prevention discussed: Yes,  with patient and mother  Have you used any form of tobacco in the last 30 days? (Cigarettes, Smokeless Tobacco, Cigars, and/or Pipes): No  Has patient been referred to the Quitline?: N/A patient is not a smoker  Patient has been referred for addiction treatment: Yes  Justin Meisenheimer, Casimiro Needle 11/04/2015, 5:19 PM

## 2015-11-04 NOTE — Progress Notes (Signed)
Patient ID: Pam Graham, female   DOB: 1971-01-28, 45 y.o.   MRN: [id number] [facility] MD Progress Note  11/04/2015 4:06 PM Pam Graham  MRN:  [id number] Subjective:   Reports ongoing  improvement , feels more optimistic and less apprehensive. No medication side effects reported, she feels medications are helping Objective: I have discussed case with treatment team and have met with patient. Patient presenting with improved mood and range of affect- at this time denies any major/significant neuro-vegetative symptoms of depression , denies any SI. Visible on unit, interactive with peers, going to groups.Presents calm, pleasant, with improved range of affect. Tolerating medications well, denies side effects. As she improves she states " I think I can discharge soon, I feel OK" -states she plans to focus on self, and on her work, and not on relationships , which she states have been at the root of recent stressors, worsening depression      Principal Problem: MDD (major depressive disorder), recurrent severe, without psychosis ([place]) Diagnosis:   Patient Active Problem List   Diagnosis Date Noted  . MDD (major depressive disorder), recurrent severe, without psychosis ([place]) [F33.2] 11/02/2015  . SYNCOPE [R55] 02/15/2010  . POLYCYSTIC OVARIAN DISEASE [E28.2] 02/14/2010  . GRAND MAL SEIZURE [O06.004] 02/14/2010  . PAC [I49.1] 02/14/2010  . PREMATURE VENTRICULAR CONTRACTIONS [I49.49] 02/14/2010  . OTHER PSORIASIS AND SIMILAR DISORDERS [L40.8] 02/14/2010  . DEAFNESS, CONGENITAL [Q16.9] 02/14/2010  . HEART MURMUR, BENIGN [R01.1] 02/14/2010   Total Time spent with patient: 20 minutes     Past Medical History:  Past Medical History  Diagnosis Date  . Ovarian cyst   . PCOS (polycystic ovarian syndrome)   . Erythrodermic psoriasis   . Seizure disorder ([place])   . Hypertension   . Fatty liver 12/26/11  . PAC (premature atrial contraction) 04/02/08  . PVC (premature ventricular contraction) 04/02/08   . Deafness in right ear     congenital  . Anal fissure   . IBS (irritable bowel syndrome)   . Anal fissure   . Tubular adenoma of colon   . Depression   . Anxiety     Past Surgical History  Procedure Laterality Date  . Cystoscopy    . Liposuction    . Abdominoplasty    . Colonoscopy     Family History:  Family History  Problem Relation Age of Onset  . Anesthesia problems Neg Hx   . Colon cancer Paternal Grandmother   . Colon polyps Father   . Colon polyps Paternal Aunt   . Heart disease Maternal Grandfather   . Heart disease      uncle  . Diabetes      aunt  . Diabetes      uncle  . Alcoholism Maternal Grandfather     Social History:  History  Alcohol Use  . Yes    Comment: socially     History  Drug Use No    Social History   Social History  . Marital Status: Married    Spouse Name: N/A  . Number of Children: N/A  . Years of Education: N/A   Social History Main Topics  . Smoking status: Never Smoker   . Smokeless tobacco: Never Used  . Alcohol Use: Yes     Comment: socially  . Drug Use: No  . Sexual Activity: Yes    Birth Control/ Protection: Pill   Other Topics Concern  . None   Social History Narrative   Additional Social History:  Sleep: Good  Appetite:  Good  Current Medications: Current Facility-Administered Medications  Medication Dose Route Frequency Provider Last Rate Last Dose  . acetaminophen (TYLENOL) tablet 650 mg  650 mg Oral Q6H PRN Laverle Hobby, PA-C      . alum & mag hydroxide-simeth (MAALOX/MYLANTA) 200-200-20 MG/5ML suspension 30 mL  30 mL Oral Q4H PRN Laverle Hobby, PA-C      . feeding supplement (ENSURE ENLIVE) (ENSURE ENLIVE) liquid 237 mL  237 mL Oral BID BM Myer Peer Cobos, MD   237 mL at 11/02/15 1629  . FLUoxetine (PROZAC) capsule 20 mg  20 mg Oral Daily Laverle Hobby, PA-C   20 mg at 11/04/15 0753  . hydrOXYzine (ATARAX/VISTARIL) tablet 25 mg  25 mg Oral Q6H PRN Laverle Hobby, PA-C   25 mg at 11/04/15  0754  . ibuprofen (ADVIL,MOTRIN) tablet 400 mg  400 mg Oral Q6H PRN Laverle Hobby, PA-C      . levETIRAcetam (KEPPRA XR) 24 hr tablet 1,000 mg  1,000 mg Oral QHS Laverle Hobby, PA-C   1,000 mg at 11/03/15 2159  . loratadine (CLARITIN) tablet 10 mg  10 mg Oral Daily Laverle Hobby, PA-C   10 mg at 11/04/15 0753  . magnesium hydroxide (MILK OF MAGNESIA) suspension 30 mL  30 mL Oral Daily PRN Laverle Hobby, PA-C      . norethindrone-ethinyl estradiol-iron (MICROGESTIN FE,GILDESS FE,LOESTRIN FE) 1.5-30 MG-MCG tablet 1 tablet  1 tablet Oral QHS Laverle Hobby, PA-C   1 tablet at 11/03/15 2201  . traZODone (DESYREL) tablet 50 mg  50 mg Oral QHS PRN Jenne Campus, MD   50 mg at 11/03/15 2159    Lab Results:  No results found for this or any previous visit (from the past 48 hour(s)).  Blood Alcohol level:  Lab Results  Component Value Date   ETH <5 11/01/2015    Physical Findings: AIMS: Facial and Oral Movements Muscles of Facial Expression: None, normal Lips and Perioral Area: None, normal Jaw: None, normal Tongue: None, normal,Extremity Movements Upper (arms, wrists, hands, fingers): None, normal Lower (legs, knees, ankles, toes): None, normal, Trunk Movements Neck, shoulders, hips: None, normal, Overall Severity Severity of abnormal movements (highest score from questions above): None, normal Incapacitation due to abnormal movements: None, normal Patient's awareness of abnormal movements (rate only patient's report): No Awareness, Dental Status Current problems with teeth and/or dentures?: No Does patient usually wear dentures?: No  CIWA:    COWS:     Musculoskeletal: Strength & Muscle Tone: within normal limits Gait & Station: normal Patient leans: N/A  Psychiatric Specialty Exam: ROS no headache, no chest pain, no vomiting   Blood pressure 117/69, pulse 92, temperature 98.8 F (37.1 C), temperature source Oral, resp. rate 16, height '5\' 6"'  (1.676 m), weight 172 lb  (78.019 kg).Body mass index is 27.77 kg/(m^2).  General Appearance: Well Groomed  Engineer, water::  Good  Speech:  Normal Rate  Volume:  Normal  Mood:  Improved, today denies feeling depressed   Affect:  Reactive, smiles at times appropriately   Thought Process:  Linear  Orientation:  Full (Time, Place, and Person)  Thought Content:  denies hallucinations, no delusions   Suicidal Thoughts:  No denies any current suicidal ideations, denies any self injurious ideations   Homicidal Thoughts:  No denies any homicidal or violent ideations   Memory:  recent and remote grossly intact   Judgement:   Improving   Insight:  improving   Psychomotor Activity:  Normal  Concentration:  Good  Recall:  Good  Fund of Knowledge:Good  Language: Good  Akathisia:  Negative  Handed:  Right  AIMS (if indicated):     Assets:  Communication Skills Desire for Improvement Resilience  ADL's:  Intact  Cognition: WNL  Sleep:  Number of Hours: 6.5  Assessment - patient's mood continues to improve and presents with improved range of affect, no significant neuro-vegetative symptoms at this time . No current SI. She is tolerating antidepressant ( Prozac) well thus far . On Keppra for history of seizure disorder, denies side effects- no seizure activity on unit, states last seizure was years ago. Treatment Plan Summary: Daily contact with patient to assess and evaluate symptoms and progress in treatment, Medication management, Plan inpatient treatment  and medications as below  Encourage ongoing group and milieu participation to work on coping skills and symptom reduction Continue Prozac 20 mgrs QDAY for depression and anxiety Continue Vistaril 25 mgrs Q 6 hours PRN for anxiety as needed  Continue Trazodone 50 mgrs QHS PRN for insomnia as needed  Continue Keppra 1000 mgrs QHS for history of seizure disorder  Treatment team working on discharge [place], MD 11/04/2015, 4:06 PM

## 2015-11-04 NOTE — Progress Notes (Signed)
Recreation Therapy Notes  Date: 05.12.2017 Time: 9:30am Location: [address]   Group Topic: Stress Management  Goal Area(s) Addresses:  Patient will actively participate in stress management techniques presented during session.   Behavioral Response: Did not attend.   Laureen Ochs Nikkolas Coomes, LRT/CTRS        Lane Hacker 11/04/2015 4:07 PM

## 2015-11-04 NOTE — BHH Group Notes (Signed)
[facility] LCSW Group Therapy Note  Date/Time: 11/04/2015 1:30PM  Type of Therapy and Topic: Group Therapy: Holding on to Grudges  Participation Level: Minimal  Description of Group:  In this group patients will be asked to explore and define a grudge. Patients will be guided to discuss their thoughts, feelings, and behaviors as to why one holds on to grudges and reasons why people have grudges. Patients will process the impact grudges have on daily life and identify thoughts and feelings related to holding on to grudges. Facilitator will challenge patients to identify ways of letting go of grudges and the benefits once released. Patients will be confronted to address why one struggles letting go of grudges. Lastly, patients will identify feelings and thoughts related to what life would look like without grudges. This group will be process-oriented, with patients participating in exploration of their own experiences as well as giving and receiving support and challenge from other group members.  Therapeutic Goals: 1. Patient will identify specific grudges related to their personal life. 2. Patient will identify feelings, thoughts, and beliefs around grudges. 3. Patient will identify how one releases grudges appropriately. 4. Patient will identify situations where they could have let go of the grudge, but instead chose to hold on.  Summary of Patient Progress  Patient participated minimally in group discussion but discussed holding a grudge against herself and that she is trying to forgive herself and "take it one day at a time".     Therapeutic Modalities:  Cognitive Behavioral Therapy Solution Focused Therapy Motivational Interviewing Brief Therapy    Tilden Fossa, LCSW Clinical Social Worker [facility] [phone]

## 2015-11-04 NOTE — Progress Notes (Signed)
Adult Psychoeducational Group Note  Date:  11/04/2015 Time:  9:18 PM  Group Topic/Focus:  Wrap-Up Group:   The focus of this group is to help patients review their daily goal of treatment and discuss progress on daily workbooks.  Participation Level:  Active  Participation Quality:  Appropriate and Attentive  Affect:  Appropriate  Cognitive:  Appropriate  Insight: Appropriate and Good  Engagement in Group:  Engaged  Modes of Intervention:  Discussion   Additional Comments:  Pt rated her day a 9 out of 10. Pt goal for tomorrow is to go home and continue to improve.   Jerline Pain 11/04/2015, 9:18 PM

## 2015-11-04 NOTE — BHH Group Notes (Signed)
   [facility] LCSW Aftercare Discharge Planning Group Note  11/04/2015  8:45 AM   Participation Quality: Alert, Appropriate and Oriented  Mood/Affect: Blunted  Depression Rating: 3  Anxiety Rating: 3  Thoughts of Suicide: Pt denies SI/HI  Will you contract for safety? Yes  Current AVH: Pt denies  Plan for Discharge/Comments: Pt attended discharge planning group and actively participated in group. CSW provided pt with today's workbook. Home with outpatient services.   Transportation Means: Pt reports access to transportation  Supports: No supports mentioned at this time  Tilden Fossa, MSW, [employer] Clinical Social Worker [employer] [phone]

## 2015-11-05 MED ORDER — HYDROXYZINE HCL 25 MG PO TABS: 25.0000 mg | ORAL_TABLET | Freq: Four times a day (QID) | ORAL | Status: DC | PRN

## 2015-11-05 MED ORDER — TRAZODONE HCL 50 MG PO TABS: 50.0000 mg | ORAL_TABLET | Freq: Every evening | ORAL | Status: DC | PRN

## 2015-11-05 MED ORDER — LEVETIRACETAM ER 500 MG PO TB24: 1000.0000 mg | ORAL_TABLET | Freq: Every day | ORAL | Status: DC

## 2015-11-05 MED ORDER — FLUOXETINE HCL 20 MG PO CAPS: 20.0000 mg | ORAL_CAPSULE | Freq: Every day | ORAL | Status: DC

## 2015-11-05 NOTE — BHH Group Notes (Signed)
[facility] Group Notes:  (Clinical Social Work)  11/05/2015     10:00-11:00AM  Summary of Progress/Problems:   The main focus of today's process group was on patient feelings about being diagnosed with a mental illness, and patients' stated desire to not rely on medications to deal with their illnesses.   Several of the group members said they are opposed to medicine because they "should not have to rely on medicine to feel normal."  Other group members stated an opposing view that when they try to go without medicines, they have ended up having major episodes.  A healthy discussion was held, and there was consensus toward the end of group that if a lot of healthy coping techniques have been tried, medication may eventually be needed.   The group discussed what life needs they personally feel are essential for them to work on developing coping skills for in order to be more mentally healthy.   The patient expressed that the life needs she wants most to address are self-forgiveness and letting go.  She expressed to those in the group who are resistant to taking medications that she views mental illness as the same as a physical illness like diabetes.  Type of Therapy:  Group Therapy - Process   Participation Level:  Active  Participation Quality:  Appropriate and Attentive  Affect:  Appropriate  Cognitive:  Appropriate and Oriented  Insight:  Engaged  Engagement in Therapy:  Engaged  Modes of Intervention:  Education, Motivational Interviewing  Selmer Dominion, LCSW 11/05/2015, 11:04 AM

## 2015-11-05 NOTE — BHH Suicide Risk Assessment (Signed)
[facility] Discharge Suicide Risk Assessment   Principal Problem: MDD (major depressive disorder), recurrent severe, without psychosis ([place]) Discharge Diagnoses:  Patient Active Problem List   Diagnosis Date Noted  . MDD (major depressive disorder), recurrent severe, without psychosis ([place]) [F33.2] 11/02/2015  . SYNCOPE [R55] 02/15/2010  . POLYCYSTIC OVARIAN DISEASE [E28.2] 02/14/2010  . GRAND MAL SEIZURE [id number] 02/14/2010  . PAC [I49.1] 02/14/2010  . PREMATURE VENTRICULAR CONTRACTIONS [I49.49] 02/14/2010  . OTHER PSORIASIS AND SIMILAR DISORDERS [L40.8] 02/14/2010  . DEAFNESS, CONGENITAL [Q16.9] 02/14/2010  . HEART MURMUR, BENIGN [R01.1] 02/14/2010    Total Time spent with patient: 30 minutes  Musculoskeletal: Strength & Muscle Tone: within normal limits Gait & Station: normal Patient leans: no lean  Psychiatric Specialty Exam: Review of Systems  Constitutional: Negative for fever.  Cardiovascular: Negative for chest pain.  Skin: Negative for rash.  Psychiatric/Behavioral: Negative for depression, suicidal ideas and substance abuse.    Blood pressure 116/80, pulse 79, temperature 98 F (36.7 C), temperature source Oral, resp. rate 16, height 5\' 6"  (1.676 m), weight 78.019 kg (172 lb).Body mass index is 27.77 kg/(m^2).  General Appearance: Casual  Eye Contact::  Fair  Speech:  Normal Rate409  Volume:  Decreased  Mood:  Euthymic  Affect:  Constricted  Thought Process:  Coherent  Orientation:  Full (Time, Place, and Person)  Thought Content:  Rumination  Suicidal Thoughts:  No  Homicidal Thoughts:  No  Memory:  Immediate;   Fair Recent;   Fair  Judgement:  Fair  Insight:  Present  Psychomotor Activity:  Normal  Concentration:  Fair  Recall:  [employer] of Knowledge:Fair  Language: Fair  Akathisia:  Negative  Handed:  Right  AIMS (if indicated):     Assets:  Desire for Improvement Social Support  Sleep:  Number of Hours: 6.5  Cognition: WNL  ADL's:  Intact    Mental Status Per Nursing Assessment::   On Admission:     Demographic Factors:  Low socioeconomic status and Unemployed  Loss Factors: Loss of significant relationship  Historical Factors: loss and deaths in family  Risk Reduction Factors:   Positive therapeutic relationship and Positive coping skills or problem solving skills  Continued Clinical Symptoms:  Dysthymia Unstable or Poor Therapeutic Relationship  Cognitive Features That Contribute To Risk:  Closed-mindedness    Suicide Risk:  Minimal: No identifiable suicidal ideation.  Patients presenting with no risk factors but with morbid ruminations; may be classified as minimal risk based on the severity of the depressive symptoms  Follow-up Information    Follow up with [facility].   Why:  on 5/17 at 6:00pm with [name] for therapy. Please call the day of your appointment to have an appointment scheduled with a psychiatrist or PA for med management. Please ask for an appointment within 30 days of your discharge    Contact information:   [place], [place] [address]   AT THIS LOCATION ONLY UNTIL Nov 04, 2015 MOVING ON Nov 07, 2015 TO: [address] [place], [place]  [address]   Phone: [phone]  Fax: [phone]     Follow up with appointments.  See discharge summary for details.  Plan Of Care/Follow-up recommendations:  Activity:  as tolerated Diet:  regular  Merian Capron, MD 11/05/2015, 10:55 AM

## 2015-11-05 NOTE — Progress Notes (Signed)
D: Patient is focused on discharge today.  She reports good sleep and appetite.  She rates her depression as a 2; hopelessness as a 2 and anxiety as a 3.  Her goal is to "go home today and continue to improve once she is home."  She states she plans to "attend groups, have positive thinking."  She has been observed up in the milieu interacting with her peers.  She has attended groups and participated.  She denies SI/HI/AVH. A: Continue to monitor medication management and MD orders.  Safety checks completed every 15 minutes per protocol.  Offer support and encouragement as needed. R: Patient is receptive to staff; discharge instructions initiated.

## 2015-11-05 NOTE — Progress Notes (Signed)
Discharge note:  Patient discharged home per MD order.  Patient received all personal belongings from unit and locker. Reviewed AVS/discharge instructions with patient; reviewed follow up appointments.  Patient indicated understanding.  She denies SI/HI/AVH.  Patient received prescriptions of medications.  Patient left ambulatory with sister-in-law.

## 2015-11-05 NOTE — BHH Group Notes (Signed)
[facility] Group Notes:  (Nursing/MHT/Case Management/Adjunct)  Date:  11/05/2015  Time:  0900 am  Type of Therapy:  Psychoeducational Skills  Participation Level:  Did Not Attend  Patient invited; declined to attend.  Pam Graham 11/05/2015, 9:07 AM

## 2015-11-05 NOTE — Discharge Summary (Signed)
Physician Discharge Summary Note  Patient:  Pam Graham is an 45 y.o., female MRN:  [id number] DOB:  January 20, 1971 Patient phone:  [phone] (home)  Patient address:   [address] [place] [address],  Total Time spent with patient: 30 minutes  Date of Admission:  11/02/2015 Date of Discharge: 11/05/2015  Reason for Admission:PER H&P- 45 year old female, states she has a history of depression and anxiety. She reports worsening depression in the context of " an accumulation of losses and stressors "  Presented to the ED voluntarily due to worsening depression, anxiety, and passive SI, but denies any actual plan or intention of hurting self  Reports she has faced significant losses , father died in 10/07/11, and separated from husband around the same time, divorce was finalized in early 10-06-13. Since then, she has had some recent relationships which have not been healthy for her or did not work out. She states she was in a relationship with a married man, that ended, but she still " obsesses " about it . She lost her job in October of 2016, possibly related to calling out too often, and states she has had some financial difficulties . She states she had been doing a little better while she was living with her mother, but recently moved out, now living independently, and " it seemed to bring everything back".  Principal Problem: MDD (major depressive disorder), recurrent severe, without psychosis [facility]) Discharge Diagnoses: Patient Active Problem List   Diagnosis Date Noted  . MDD (major depressive disorder), recurrent severe, without psychosis ([place]) [F33.2] 11/02/2015  . SYNCOPE [R55] 02/15/2010  . POLYCYSTIC OVARIAN DISEASE [E28.2] 02/14/2010  . GRAND MAL SEIZURE [id number] 02/14/2010  . PAC [I49.1] 02/14/2010  . PREMATURE VENTRICULAR CONTRACTIONS [I49.49] 02/14/2010  . OTHER PSORIASIS AND SIMILAR DISORDERS [L40.8] 02/14/2010  . DEAFNESS, CONGENITAL [Q16.9] 02/14/2010  . HEART  MURMUR, BENIGN [R01.1] 02/14/2010    Past Psychiatric History:See Above  Past Medical History:  Past Medical History  Diagnosis Date  . Ovarian cyst   . PCOS (polycystic ovarian syndrome)   . Erythrodermic psoriasis   . Seizure disorder ([place])   . Hypertension   . Fatty liver 12/26/11  . PAC (premature atrial contraction) 04/02/08  . PVC (premature ventricular contraction) 04/02/08  . Deafness in right ear     congenital  . Anal fissure   . IBS (irritable bowel syndrome)   . Anal fissure   . Tubular adenoma of colon   . Depression   . Anxiety     Past Surgical History  Procedure Laterality Date  . Cystoscopy    . Liposuction    . Abdominoplasty    . Colonoscopy     Family History:  Family History  Problem Relation Age of Onset  . Anesthesia problems Neg Hx   . Colon cancer Paternal Grandmother   . Colon polyps Father   . Colon polyps Paternal Aunt   . Heart disease Maternal Grandfather   . Heart disease      uncle  . Diabetes      aunt  . Diabetes      uncle  . Alcoholism Maternal Grandfather    Family Psychiatric  History: See Above Social History:  History  Alcohol Use  . Yes    Comment: socially     History  Drug Use No    Social History   Social History  . Marital Status: Married    Spouse Name: N/A  .  Number of Children: N/A  . Years of Education: N/A   Social History Main Topics  . Smoking status: Never Smoker   . Smokeless tobacco: Never Used  . Alcohol Use: Yes     Comment: socially  . Drug Use: No  . Sexual Activity: Yes    Birth Control/ Protection: Pill   Other Topics Concern  . None   Social History Narrative    Hospital Course: Pam Graham was admitted for MDD (major depressive disorder), recurrent severe, without psychosis ([place]) , with psychosis and crisis management.  Pt was treated discharged with the medications listed below under Medication List.  Medical problems were identified and treated as needed.  Home medications  were restarted as appropriate.  Improvement was monitored by observation and Pam Graham 's daily report of symptom reduction.  Emotional and mental status was monitored by daily self-inventory reports completed by Pam Graham and clinical staff.         Pam Graham was evaluated by the treatment team for stability and plans for continued recovery upon discharge. Pam Graham 's motivation was an integral factor for scheduling further treatment. Employment, transportation, bed availability, health status, family support, and any pending legal issues were also considered during hospital stay. Pt was offered further treatment options upon discharge including but not limited to Residential, Intensive Outpatient, and Outpatient treatment.  Pam Graham will follow up with the services as listed below under Follow Up Information.     Upon completion of this admission the patient was both mentally and medically stable for discharge denying suicidal/homicidal ideation, auditory/visual/tactile hallucinations, delusional thoughts and paranoia.    Pam Graham responded well to treatment with Trazodone, Prozac and Keppra without adverse effects. Pt demonstrated improvement without reported or observed adverse effects to the point of stability appropriate for outpatient management. Pertinent labs include: Lipid panel and CMP, Prolactin 59.0 (high), Hgb A1C 5.7 (high), for which outpatient follow-up is necessary for lab recheck as mentioned below. Reviewed CBC, CMP, BAL, and UDS; all unremarkable aside from noted exceptions.   Physical Findings: AIMS: Facial and Oral Movements Muscles of Facial Expression: None, normal Lips and Perioral Area: None, normal Jaw: None, normal Tongue: None, normal,Extremity Movements Upper (arms, wrists, hands, fingers): None, normal Lower (legs, knees, ankles, toes): None, normal, Trunk Movements Neck, shoulders, hips: None, normal, Overall Severity Severity of abnormal  movements (highest score from questions above): None, normal Incapacitation due to abnormal movements: None, normal Patient's awareness of abnormal movements (rate only patient's report): No Awareness, Dental Status Current problems with teeth and/or dentures?: No Does patient usually wear dentures?: No  CIWA:    COWS:     Musculoskeletal: Strength & Muscle Tone: within normal limits Gait & Station: normal Patient leans: N/A  Psychiatric Specialty Exam: Review of Systems  Psychiatric/Behavioral: Negative for suicidal ideas and hallucinations. Depression: stable. Nervous/anxious: stable.     Blood pressure 116/80, pulse 79, temperature 98 F (36.7 C), temperature source Oral, resp. rate 16, height 5\' 6"  (1.676 m), weight 78.019 kg (172 lb).Body mass index is 27.77 kg/(m^2).  Have you used any form of tobacco in the last 30 days? (Cigarettes, Smokeless Tobacco, Cigars, and/or Pipes): No  Has this patient used any form of tobacco in the last 30 days? (Cigarettes, Smokeless Tobacco, Cigars, and/or Pipes)  No  Blood Alcohol level:  Lab Results  Component Value Date   ETH <5 [id number]    Metabolic Disorder Labs:  Lab Results  Component Value Date   HGBA1C 5.7* 11/02/2015   MPG 117 11/02/2015   Lab Results  Component Value Date   PROLACTIN 59.0* 11/02/2015   Lab Results  Component Value Date   CHOL 208* 11/02/2015   TRIG 115 11/02/2015   HDL 39* 11/02/2015   CHOLHDL 5.3 11/02/2015   VLDL 23 11/02/2015   LDLCALC 146* 11/02/2015    See Psychiatric Specialty Exam and Suicide Risk Assessment completed by Attending Physician prior to discharge.  Discharge destination:  Home  Is patient on multiple antipsychotic therapies at discharge:  No   Has Patient had three or more failed trials of antipsychotic monotherapy by history:  No  Recommended Plan for Multiple Antipsychotic Therapies: NA     Medication List    ASK your doctor about these medications      Indication    ALLERGY EYE DROPS OP  Apply 1 drop to eye daily as needed (allergies).      cetirizine 10 MG tablet  Commonly known as:  ZYRTEC  Take 10 mg by mouth daily.      ibuprofen 200 MG tablet  Commonly known as:  ADVIL,MOTRIN  Take 400 mg by mouth every 6 (six) hours as needed for moderate pain.      levETIRAcetam 500 MG 24 hr tablet  Commonly known as:  KEPPRA XR  Take 1,000 mg by mouth at bedtime.      norethindrone-ethinyl estradiol-iron 1.5-30 MG-MCG tablet  Commonly known as:  MICROGESTIN FE,GILDESS FE,LOESTRIN FE  Take 1 tablet by mouth at bedtime.            Follow-up Information    Follow up with [facility].   Why:  on 5/17 at 6:00pm with [name] for therapy. Please call the day of your appointment to have an appointment scheduled with a psychiatrist or PA for med management. Please ask for an appointment within 30 days of your discharge    Contact information:   [place], [place] [address]   AT THIS LOCATION ONLY UNTIL Nov 04, 2015 MOVING ON Nov 07, 2015 TO: [place], [place]  [address]   Phone: [phone]  Fax: [phone]      Follow-up recommendations:  Activity:  as tolerated Diet:  heart healthy  Comments: Take all medications as prescribed. Keep all follow-up appointments as scheduled.  Do not consume alcohol or use illegal drugs while on prescription medications. Report any adverse effects from your medications to your primary care provider promptly.  In the event of recurrent symptoms or worsening symptoms, call 911, a crisis hotline, or go to the nearest emergency department for evaluation.  Derrill Center, NP 11/05/2015, 10:55 AM  I have examined the patient and agree with the discharge plan and findings. I have also done suicide assessment on this patient.

## 2015-11-05 NOTE — Progress Notes (Signed)
Pt reports her day has been progressively better.  She denies SI/HI/AVH.  She states that she is probably going home tomorrow and will follow up with [facility].  Pt voices no needs or concerns at this time.  She states she has been going to groups and that the medications seem to be helping her.  Support and encouragement offered.  Discharge plans are in process.  Safety maintained with q15 minute checks.

## 2015-11-05 NOTE — Plan of Care (Signed)
Problem: Diagnosis: Increased Risk For Suicide Attempt Goal: LTG-Patient Will Report Improved Mood and Deny Suicidal LTG (by discharge) Patient will report improved mood and deny suicidal ideation.  Outcome: Progressing Patient reports an improvement in her mood; denies suicidal ideation.

## 2015-11-10 ENCOUNTER — Ambulatory Visit (INDEPENDENT_AMBULATORY_CARE_PROVIDER_SITE_OTHER): Payer: BLUE CROSS/BLUE SHIELD | Admitting: Internal Medicine

## 2015-11-10 VITALS — BP 132/74 | HR 68 | Ht 67.75 in | Wt 169.0 lb

## 2015-11-10 DIAGNOSIS — K648 Other hemorrhoids: Secondary | ICD-10-CM | POA: Diagnosis not present

## 2015-11-10 DIAGNOSIS — R151 Fecal smearing: Secondary | ICD-10-CM

## 2015-11-10 MED ORDER — GLYCOPYRROLATE 2 MG PO TABS: 2.0000 mg | ORAL_TABLET | Freq: Two times a day (BID) | ORAL | Status: DC

## 2015-11-10 NOTE — Progress Notes (Signed)
Patient ID: Pam Graham, female   DOB: 09/17/1970, 45 y.o.   MRN: [id number]   Pam Graham is a 45 yo female with PMH of  adenomatous colon polyps, internal hemorrhoids, remote anal fissure and IBS who presents for follow-up. She underwent hemorrhoidal banding 3 with the third appointment being on 08/08/2015. Banding was performed due to hemorrhoidal bleeding, fecal smearing and difficulty with the need to constantly wipe. Symptoms improved dramatically and she was very happy. She was using Benefiber 2 tablespoons daily.  After the third banding she did very well but about 4-6 weeks later she had return of intermittent, scant red blood with bowel movement. This was painless. She's had an episode of fecal smearing and feels the need to constantly re-wipe after bowel movement. She has stopped Benefiber but her recurrent issues occurred while she was taking  PROCEDURE NOTE: The patient presents with symptomatic grade 2 internal hemorrhoids, requesting rubber band ligation of her hemorrhoidal disease.  All risks, benefits and alternative forms of therapy were described and informed consent was obtained.  In the Left Lateral Decubitus position anoscopic examination revealed grade 2 internal hemorrhoids in the left lateral position.  The anorectum was pre-medicated with 0.125% NTG ointment. The decision was made to band the LL internal hemorrhoid, and the [place] was used to perform band ligation without complication.  Digital anorectal examination was then performed to assure proper positioning of the band, and to adjust the banded tissue as required.  The patient was discharged home without pain or other issues.  Dietary and behavioral recommendations were given and along with follow-up instructions.     The following adjunctive treatments were recommended: Begin Robinul Forte 2 mg twice a day as an anticholinergic to try to help decrease fecal smearing and increase stool firmness  The  patient will return as needed for  follow-up and possible additional banding as required. No complications were encountered and the patient tolerated the procedure well.

## 2015-11-10 NOTE — Patient Instructions (Signed)
We have sent the following medications to your pharmacy for you to pick up at your convenience:robinul-forte.  HEMORRHOID BANDING PROCEDURE    FOLLOW-UP CARE   1. The procedure you have had should have been relatively painless since the banding of the area involved does not have nerve endings and there is no pain sensation.  The rubber band cuts off the blood supply to the hemorrhoid and the band may fall off as soon as 48 hours after the banding (the band may occasionally be seen in the toilet bowl following a bowel movement). You may notice a temporary feeling of fullness in the rectum which should respond adequately to plain Tylenol or Motrin.  2. Following the banding, avoid strenuous exercise that evening and resume full activity the next day.  A sitz bath (soaking in a warm tub) or bidet is soothing, and can be useful for cleansing the area after bowel movements.     3. To avoid constipation, take two tablespoons of natural wheat bran, natural oat bran, flax, Benefiber or any over the counter fiber supplement and increase your water intake to 7-8 glasses daily.    4. Unless you have been prescribed anorectal medication, do not put anything inside your rectum for two weeks: No suppositories, enemas, fingers, etc.  5. Occasionally, you may have more bleeding than usual after the banding procedure.  This is often from the untreated hemorrhoids rather than the treated one.  Don't be concerned if there is a tablespoon or so of blood.  If there is more blood than this, lie flat with your bottom higher than your head and apply an ice pack to the area. If the bleeding does not stop within a half an hour or if you feel faint, call our office at [phone] or go to the emergency room.  6. Problems are not common; however, if there is a substantial amount of bleeding, severe pain, chills, fever or difficulty passing urine (very rare) or other problems, you should call us at [phone] or  report to the nearest emergency room.  7. Do not stay seated continuously for more than 2-3 hours for a day or two after the procedure.  Tighten your buttock muscles 10-15 times every two hours and take 10-15 deep breaths every 1-2 hours.  Do not spend more than a few minutes on the toilet if you cannot empty your bowel; instead re-visit the toilet at a later time.

## 2017-06-07 DIAGNOSIS — B070 Plantar wart: Secondary | ICD-10-CM | POA: Diagnosis not present

## 2017-07-18 DIAGNOSIS — R569 Unspecified convulsions: Secondary | ICD-10-CM | POA: Diagnosis not present

## 2017-07-18 DIAGNOSIS — I10 Essential (primary) hypertension: Secondary | ICD-10-CM | POA: Diagnosis not present

## 2017-07-22 DIAGNOSIS — B070 Plantar wart: Secondary | ICD-10-CM | POA: Diagnosis not present

## 2017-08-14 DIAGNOSIS — F419 Anxiety disorder, unspecified: Secondary | ICD-10-CM | POA: Diagnosis not present

## 2017-08-19 DIAGNOSIS — L409 Psoriasis, unspecified: Secondary | ICD-10-CM | POA: Diagnosis not present

## 2017-09-05 DIAGNOSIS — Z6830 Body mass index (BMI) 30.0-30.9, adult: Secondary | ICD-10-CM | POA: Diagnosis not present

## 2017-09-05 DIAGNOSIS — Z1231 Encounter for screening mammogram for malignant neoplasm of breast: Secondary | ICD-10-CM | POA: Diagnosis not present

## 2017-09-05 DIAGNOSIS — Z01419 Encounter for gynecological examination (general) (routine) without abnormal findings: Secondary | ICD-10-CM | POA: Diagnosis not present

## 2017-09-17 DIAGNOSIS — Z79899 Other long term (current) drug therapy: Secondary | ICD-10-CM | POA: Diagnosis not present

## 2017-09-17 DIAGNOSIS — L409 Psoriasis, unspecified: Secondary | ICD-10-CM | POA: Diagnosis not present

## 2017-09-18 DIAGNOSIS — N870 Mild cervical dysplasia: Secondary | ICD-10-CM | POA: Diagnosis not present

## 2017-09-18 DIAGNOSIS — N879 Dysplasia of cervix uteri, unspecified: Secondary | ICD-10-CM | POA: Diagnosis not present

## 2017-11-20 DIAGNOSIS — L409 Psoriasis, unspecified: Secondary | ICD-10-CM | POA: Diagnosis not present

## 2017-12-24 DIAGNOSIS — B078 Other viral warts: Secondary | ICD-10-CM | POA: Diagnosis not present

## 2017-12-24 DIAGNOSIS — D1800 Hemangioma unspecified site: Secondary | ICD-10-CM | POA: Diagnosis not present

## 2017-12-24 DIAGNOSIS — L814 Other melanin hyperpigmentation: Secondary | ICD-10-CM | POA: Diagnosis not present

## 2017-12-24 DIAGNOSIS — L821 Other seborrheic keratosis: Secondary | ICD-10-CM | POA: Diagnosis not present

## 2017-12-24 DIAGNOSIS — D225 Melanocytic nevi of trunk: Secondary | ICD-10-CM | POA: Diagnosis not present

## 2018-02-08 DIAGNOSIS — M25562 Pain in left knee: Secondary | ICD-10-CM | POA: Diagnosis not present

## 2018-02-10 DIAGNOSIS — Z79899 Other long term (current) drug therapy: Secondary | ICD-10-CM | POA: Diagnosis not present

## 2018-02-10 DIAGNOSIS — L409 Psoriasis, unspecified: Secondary | ICD-10-CM | POA: Diagnosis not present

## 2018-02-17 DIAGNOSIS — M2242 Chondromalacia patellae, left knee: Secondary | ICD-10-CM | POA: Diagnosis not present

## 2018-02-19 DIAGNOSIS — F419 Anxiety disorder, unspecified: Secondary | ICD-10-CM | POA: Diagnosis not present

## 2018-02-21 DIAGNOSIS — M25562 Pain in left knee: Secondary | ICD-10-CM | POA: Diagnosis not present

## 2018-03-26 DIAGNOSIS — R87612 Low grade squamous intraepithelial lesion on cytologic smear of cervix (LGSIL): Secondary | ICD-10-CM | POA: Diagnosis not present

## 2018-03-26 DIAGNOSIS — N879 Dysplasia of cervix uteri, unspecified: Secondary | ICD-10-CM | POA: Diagnosis not present

## 2018-04-25 DIAGNOSIS — M25562 Pain in left knee: Secondary | ICD-10-CM | POA: Diagnosis not present

## 2018-04-30 DIAGNOSIS — L409 Psoriasis, unspecified: Secondary | ICD-10-CM | POA: Diagnosis not present

## 2018-04-30 DIAGNOSIS — Z23 Encounter for immunization: Secondary | ICD-10-CM | POA: Diagnosis not present

## 2018-06-02 DIAGNOSIS — R52 Pain, unspecified: Secondary | ICD-10-CM | POA: Diagnosis not present

## 2018-06-02 DIAGNOSIS — M65332 Trigger finger, left middle finger: Secondary | ICD-10-CM | POA: Diagnosis not present

## 2018-06-02 DIAGNOSIS — M65342 Trigger finger, left ring finger: Secondary | ICD-10-CM | POA: Diagnosis not present

## 2018-06-13 DIAGNOSIS — Z1211 Encounter for screening for malignant neoplasm of colon: Secondary | ICD-10-CM | POA: Diagnosis not present

## 2018-06-13 DIAGNOSIS — Z86010 Personal history of colonic polyps: Secondary | ICD-10-CM | POA: Diagnosis not present

## 2018-07-01 DIAGNOSIS — L408 Other psoriasis: Secondary | ICD-10-CM | POA: Diagnosis not present

## 2018-07-01 DIAGNOSIS — E282 Polycystic ovarian syndrome: Secondary | ICD-10-CM | POA: Diagnosis not present

## 2018-07-01 DIAGNOSIS — R635 Abnormal weight gain: Secondary | ICD-10-CM | POA: Diagnosis not present

## 2018-07-01 DIAGNOSIS — N926 Irregular menstruation, unspecified: Secondary | ICD-10-CM | POA: Diagnosis not present

## 2018-07-16 DIAGNOSIS — M65332 Trigger finger, left middle finger: Secondary | ICD-10-CM | POA: Diagnosis not present

## 2018-07-16 DIAGNOSIS — M65342 Trigger finger, left ring finger: Secondary | ICD-10-CM | POA: Diagnosis not present

## 2018-08-06 ENCOUNTER — Ambulatory Visit: Payer: BLUE CROSS/BLUE SHIELD | Admitting: Psychiatry

## 2018-08-06 DIAGNOSIS — F332 Major depressive disorder, recurrent severe without psychotic features: Secondary | ICD-10-CM | POA: Diagnosis not present

## 2018-08-06 MED ORDER — SERTRALINE HCL 50 MG PO TABS: 50.0000 mg | ORAL_TABLET | Freq: Every day | ORAL | 5 refills | Status: DC

## 2018-08-06 MED ORDER — SERTRALINE HCL 50 MG PO TABS: 50.0000 mg | ORAL_TABLET | Freq: Every day | ORAL | 3 refills | Status: DC

## 2018-08-06 NOTE — Progress Notes (Signed)
PMH ,PCOS,insulin resistance,possible seizure disorder 1 grand mal seizure over 10 yrs ago .questionable seizures in 2011.

## 2018-08-20 DIAGNOSIS — Z79899 Other long term (current) drug therapy: Secondary | ICD-10-CM | POA: Diagnosis not present

## 2018-08-20 DIAGNOSIS — L409 Psoriasis, unspecified: Secondary | ICD-10-CM | POA: Diagnosis not present

## 2018-09-03 DIAGNOSIS — E282 Polycystic ovarian syndrome: Secondary | ICD-10-CM | POA: Diagnosis not present

## 2018-09-03 DIAGNOSIS — Z13820 Encounter for screening for osteoporosis: Secondary | ICD-10-CM | POA: Diagnosis not present

## 2018-09-03 DIAGNOSIS — E669 Obesity, unspecified: Secondary | ICD-10-CM | POA: Diagnosis not present

## 2018-09-03 DIAGNOSIS — R7303 Prediabetes: Secondary | ICD-10-CM | POA: Diagnosis not present

## 2018-09-03 DIAGNOSIS — N912 Amenorrhea, unspecified: Secondary | ICD-10-CM | POA: Diagnosis not present

## 2018-09-18 ENCOUNTER — Other Ambulatory Visit: Payer: Self-pay

## 2018-09-18 ENCOUNTER — Ambulatory Visit (INDEPENDENT_AMBULATORY_CARE_PROVIDER_SITE_OTHER): Payer: BLUE CROSS/BLUE SHIELD | Admitting: Psychiatry

## 2018-09-18 DIAGNOSIS — Z630 Problems in relationship with spouse or partner: Secondary | ICD-10-CM

## 2018-09-18 DIAGNOSIS — F411 Generalized anxiety disorder: Secondary | ICD-10-CM

## 2018-09-18 DIAGNOSIS — F332 Major depressive disorder, recurrent severe without psychotic features: Secondary | ICD-10-CM
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
## 2018-10-02 ENCOUNTER — Ambulatory Visit (INDEPENDENT_AMBULATORY_CARE_PROVIDER_SITE_OTHER): Payer: BLUE CROSS/BLUE SHIELD | Admitting: Psychiatry

## 2018-10-02 ENCOUNTER — Other Ambulatory Visit: Payer: Self-pay

## 2018-10-02 DIAGNOSIS — Z630 Problems in relationship with spouse or partner: Secondary | ICD-10-CM | POA: Diagnosis not present

## 2018-10-02 DIAGNOSIS — F411 Generalized anxiety disorder: Secondary | ICD-10-CM | POA: Diagnosis not present
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
## 2018-10-15 DIAGNOSIS — Z01419 Encounter for gynecological examination (general) (routine) without abnormal findings: Secondary | ICD-10-CM | POA: Diagnosis not present

## 2018-10-15 DIAGNOSIS — Z1231 Encounter for screening mammogram for malignant neoplasm of breast: Secondary | ICD-10-CM | POA: Diagnosis not present

## 2018-10-15 DIAGNOSIS — Z6833 Body mass index (BMI) 33.0-33.9, adult: Secondary | ICD-10-CM | POA: Diagnosis not present

## 2018-10-16 ENCOUNTER — Ambulatory Visit: Payer: BLUE CROSS/BLUE SHIELD | Admitting: Psychiatry

## 2018-10-30 ENCOUNTER — Other Ambulatory Visit: Payer: Self-pay

## 2018-10-30 ENCOUNTER — Ambulatory Visit (INDEPENDENT_AMBULATORY_CARE_PROVIDER_SITE_OTHER): Payer: BLUE CROSS/BLUE SHIELD | Admitting: Psychiatry

## 2018-10-30 DIAGNOSIS — Z630 Problems in relationship with spouse or partner: Secondary | ICD-10-CM

## 2018-10-30 DIAGNOSIS — F411 Generalized anxiety disorder: Secondary | ICD-10-CM

## 2018-10-30 DIAGNOSIS — F332 Major depressive disorder, recurrent severe without psychotic features: Secondary | ICD-10-CM | POA: Diagnosis not present

## 2018-10-30 NOTE — Progress Notes (Signed)
Psychotherapy Progress Note [facility] Pam Moore, PhD LP  Patient ID: Pam Graham     MRN: [id number]     Therapy format: Individual psychotherapy Date: 10/30/2018     Start: 1:09p Stop: 1:57p Time Spent: 48 min  Telehealth visit I connected with patient by a video enabled telemedicine/telehealth application or telephone, with her informed consent, and verified patient privacy and that I am speaking with the correct person using two identifiers.  I was located at my home and patient at her car.  We discussed the limitations, risks, and security and privacy concerns associated with telehealth services and the availability of in-person appointments, including awareness that she may be responsible for charges related to the service, and she expressed understanding and agreed to proceed.  I discussed treatment planning with her, with opportunity to ask and answer all questions. Agreed with the plan, demonstrated an understanding of the instructions, and made her aware to call our office if symptoms worsen or she feels she is in a crisis state and needs immediate contact.  Session narrative (presenting needs, interim history, self-report of stressors and symptoms, applications of prior therapy, status changes, and interventions made in session) Left the subject of Pam Graham and Pam Graham's sexting dormant until Friday, after feeling an escalating anxiety and asked about it.  Pam Graham answered honestly that they had sexted again, denied they would hook up in person ... because of COVID .Marland Kitchen. but said if it was more available, he would have a nooner or something.  Not immediately angry, but later that day, he went cold and jetted out; told her when they went out for drive-through that he was pissed, doesn't want to be "analyzed", she always does this, etc.  And he took his phone to [place], apparently to have his phone swept for surveillance software or hardware.  After that, came around and  apologized, ashamed of himself, told her she deserves better.  Probed history, Pam Graham has hx of alcohol and opiates, best can tell 25-30- years ago.  Also suffering Parkinson's at age 5 and job in jeopardy.  Obviously sympathetic, though not justification for being harsh.  Offered possibility he is communicating the harshness he feels in his life as well as acting on changed brain function from past drug dependence.  If so, reasons to bring a forgiving attitude and to discount his behavior more than react to it.  PT loves him, wants to be with him, doesn't want to try to leave during Pam Graham concerns, does appreciate the savings of living together, not terrified of losing him, just needs to see if he can respond compassionately and respect her equality as an adult even while he deals with several things that bother him personally.  A year ago, did go to [place] on her own -- while seeing Pam Graham, still married at the time -- recounted travels and sights taken in.  Good memory.  Therapeutic modalities: Cognitive Behavioral Therapy, Assertiveness/Communication and Solution-Oriented/Positive Psychology  Mental Status/Observations:  Appearance:   Casual     Behavior:  Appropriate  Motor:  Normal  Speech/Language:   Clear and Coherent  Affect:  Appropriate  Mood:  appropriate to content  Thought process:  normal  Thought content:    WNL  Sensory/Perceptual disturbances:    WNL  Orientation:  grossly intact  Attention:  Good  Concentration:  Good  Memory:  WNL  Insight:    Good  Judgment:   Good  Impulse Control:  Good   Risk  Assessment: Danger to Self:  No Self-injurious Behavior: No Danger to Others: No Duty to Warn:no Physical Aggression / Violence:No  Access to Firearms a concern: No   Diagnosis:   ICD-10-CM   1. Generalized anxiety disorder F41.1   2. MDD (major depressive disorder), recurrent severe, without psychosis ([place]) F33.2   3. Relationship problem between partners Z63.0     Assessment of progress:  improving  Plan:  . Communication tips for working with Pam Graham's reactions and resistance . Other recommendations/advice as noted above . Continue to utilize previously learned skills ad lib . Maintain medication as prescribed and work faithfully with relevant prescriber(s) if any changes are desired or seem indicated . Call the clinic on-call service, present to ER, or call 911 if any life-threatening psychiatric crisis Return in about 3 weeks (around 11/20/2018) for as available, will call.   Blanchie Serve, PhD [place] Licensed Psychologist

## 2018-11-24 ENCOUNTER — Ambulatory Visit: Payer: BLUE CROSS/BLUE SHIELD | Admitting: Psychiatry

## 2018-11-24 ENCOUNTER — Other Ambulatory Visit: Payer: Self-pay

## 2018-11-24 DIAGNOSIS — F411 Generalized anxiety disorder: Secondary | ICD-10-CM | POA: Diagnosis not present

## 2018-11-24 DIAGNOSIS — Z630 Problems in relationship with spouse or partner: Secondary | ICD-10-CM | POA: Diagnosis not present

## 2018-11-24 NOTE — Progress Notes (Signed)
Psychotherapy Progress Note [facility] [name], PhD LP  Patient ID: Pam Graham     MRN: [id number]     Therapy format: Individual psychotherapy Date: 11/24/2018     Start: 3:08p Stop: 3:53p Time Spent: 45 min  Session narrative (presenting needs, interim history, self-report of stressors and symptoms, applications of prior therapy, status changes, and interventions made in session) Has not been able to have conversation with [name] until today.  Had worked on letter to him, on friend's advice.  Another former boyfriend, Patrick Jupiter, got in touch, seemingly to check her availability for a relationship, sparked some anxiety.  Having been unable to finish the letter, spoke with [name] today, at a relaxed enough moment, reading her letter, which affirmed her deep love for him and challenged him to consider ground rules about sexting.  He was able to spontaneously acknowledge that it is a problem, reveal that he has sexted two women, that is does function as a pickmeup vs. depression.  Agreed between them their relationship is maturing and improving, and have an agreement in principle for him to come back to it.  Affirmed PT's good work sorting out her own feelings and framing a truly therapeutic confrontation, which has obviously paid off in good will, good listening, and better respect from [place].  Have now planned a trip June 18 to [place] to visit Mike's father, on [place].  Anticipates pleasant time, good bonding, enjoyable social contact within limits of COVID precautions.  Discussed next steps, e.g., if [name] does not take initiative to come back and tell her about thinking over his sexting habit (i.e., after having labelled it a problem, suggesting accepting a need to change it).  Considered different scenarios (silence and she brings it up again, he says he can't/won't let go the habit) and how she would want to see herself handle.  Has thought further about the matter of  Mike's sexting habit, does not feel now that it need be a dealbreaker for her.  Reassured by [facility] honesty and openness, which seemed, after all, to be the true signals of trust and acceptance between them that mattered so much for relationship satisfaction.  Clarified her values and stance, which is that, as long as he is honest with her and will prevent the habit from turning into any kind of competing attachment that would crowd out their own relationship or his honesty, she can let it be the same as a game, hobby, self-soothing activity.  (Paradoxically, removing the taboo may actually remove its appeal as well.)  On a side note, but importantly, [name] has now found an effective ED treatment, which has enabled them to have satisfying sex again, and which has clearly improved Mike's self-esteem and mood as well as their bond.  Affirmed and encouraged.  Therapeutic modalities: Cognitive Behavioral Therapy, Assertiveness/Communication and Solution-Oriented/Positive Psychology  Mental Status/Observations:  Appearance:   Casual and Neat     Behavior:  Appropriate  Motor:  Normal  Speech/Language:   Clear and Coherent  Affect:  Appropriate  Mood:  much less anxious, responsive  Thought process:  normal  Thought content:    WNL  Sensory/Perceptual disturbances:    WNL  Orientation:  grossly intact  Attention:  Good  Concentration:  Good  Memory:  WNL  Insight:    Good  Judgment:   Good  Impulse Control:  Good   Risk Assessment: Danger to Self:  No Self-injurious Behavior: No Danger to Others: No Duty  to Warn:no Physical Aggression / Violence:No  Access to Firearms a concern: No   Diagnosis:   ICD-10-CM   1. Generalized anxiety disorder F41.1   2. Relationship problem between partners Z63.0    Assessment of progress:  improving  Plan:  . Consider further how she would want to handle neglect or resistance to talking about Mike's sexting, maintaining her own therapeutic stance and  affirming honesty both ways . Other recommendations/advice as noted above . Continue to utilize previously learned skills ad lib . Maintain medication as prescribed and work faithfully with relevant prescriber(s) if any changes are desired or seem indicated . Call the clinic on-call service, present to ER, or call 911 if any life-threatening psychiatric crisis Return in about 5 weeks (around 12/29/2018).   Blanchie Serve, PhD [place] Licensed Psychologist

## 2018-11-27 DIAGNOSIS — M25562 Pain in left knee: Secondary | ICD-10-CM | POA: Diagnosis not present

## 2018-11-27 DIAGNOSIS — M7711 Lateral epicondylitis, right elbow: Secondary | ICD-10-CM | POA: Diagnosis not present

## 2018-12-16 ENCOUNTER — Ambulatory Visit: Payer: BC Managed Care – PPO | Admitting: Psychiatry

## 2018-12-16 ENCOUNTER — Encounter

## 2018-12-16 ENCOUNTER — Other Ambulatory Visit: Payer: Self-pay

## 2018-12-16 DIAGNOSIS — F411 Generalized anxiety disorder: Secondary | ICD-10-CM

## 2018-12-16 DIAGNOSIS — Z630 Problems in relationship with spouse or partner: Secondary | ICD-10-CM | POA: Diagnosis not present

## 2018-12-16 NOTE — Progress Notes (Signed)
Psychotherapy Progress Note [facility] [name], PhD LP  Patient ID: Pam Graham     MRN: [id number]     Therapy format: Individual psychotherapy Date: 12/16/2018     Start: 3:30p Stop: 4:17p Time Spent: 47 min Location: in-person   Session narrative (presenting needs, interim history, self-report of stressors and symptoms, applications of prior therapy, status changes, and interventions made in session) [place] trip with [name] fell off due to virus concerns.  Will plan something less extravagant to [facility].   Wants to discuss her read on the relationship with [name].  Hx last summer of him showing her expensive homes with a realtor, despite dating at the time he was living with his wife [place].  PT had a meeting with Mickie last summer in which she was unexpectedly helpful, took a sincere interest in helping PT think critically about getting involved with her husband, whom she knew by then to be a serial philanderer, liar, poseur.  Since then, [name] has several times thrown off on PT for having met with her.  PT has noted pattern of [name] spending long times by himself and long times uncommunicative, both read as depression, though sometimes it could have also been about his recently admitted porn/sexting addiction.    Interpreted problem of the house itself, i.e., that Mike's history, regrets, and all not-yet-worked out feelings from his life with wife and son exist there, living in the place where it all happened.  Hx of lip service paid to PT about giving her freedom to redecorate, bring in her things, then coming back later and blasting her for "taking over."    PT acknowledges she feels dependent on the relationship for financial security.  Explored possible unconscious motivation to reenact relationship with father (who was also condemning, forbidding).    Offered ideas for converting their interactions to more fair fighting, when conflict must  occur.  Therapeutic modalities: Cognitive Behavioral Therapy, Assertiveness/Communication and Interpersonal  Mental Status/Observations:  Appearance:   Casual     Behavior:  Appropriate  Motor:  Normal  Speech/Language:   Clear and Coherent  Affect:  Appropriate, responsive  Mood:  anxious and moderately  Thought process:  normal  Thought content:    WNL and worries  Sensory/Perceptual disturbances:    WNL  Orientation:  grossly intact  Attention:  Good  Concentration:  Good  Memory:  WNL  Insight:    Good  Judgment:   Good  Impulse Control:  Fair   Risk Assessment: Danger to Self:  No Self-injurious Behavior: No Danger to Others: No Duty to Warn:no Physical Aggression / Violence:No  Access to Firearms a concern: No   Diagnosis:   ICD-10-CM   1. Generalized anxiety disorder  F41.1   2. Relationship problem between partners  Z63.0    Assessment of progress:  progressing  Plan:  . Fair fighting tips . Other recommendations/advice as noted above . Continue to utilize previously learned skills ad lib . Maintain medication as prescribed and work faithfully with relevant prescriber(s) if any changes are desired or seem indicated . Call the clinic on-call service, present to ER, or call 911 if any life-threatening psychiatric crisis Return in about 2 weeks (around 12/30/2018).   Blanchie Serve, PhD [name], PhD LP Clinical Psychologist, [facility] [address], [place], [place] [address] [phone]

## 2018-12-22 DIAGNOSIS — M7711 Lateral epicondylitis, right elbow: Secondary | ICD-10-CM | POA: Diagnosis not present

## 2018-12-22 DIAGNOSIS — M6281 Muscle weakness (generalized): Secondary | ICD-10-CM | POA: Diagnosis not present

## 2018-12-24 DIAGNOSIS — M7711 Lateral epicondylitis, right elbow: Secondary | ICD-10-CM | POA: Diagnosis not present

## 2018-12-24 DIAGNOSIS — M6281 Muscle weakness (generalized): Secondary | ICD-10-CM | POA: Diagnosis not present

## 2019-01-01 ENCOUNTER — Other Ambulatory Visit: Payer: Self-pay

## 2019-01-01 ENCOUNTER — Ambulatory Visit (INDEPENDENT_AMBULATORY_CARE_PROVIDER_SITE_OTHER): Payer: BC Managed Care – PPO | Admitting: Psychiatry

## 2019-01-01 DIAGNOSIS — F411 Generalized anxiety disorder: Secondary | ICD-10-CM | POA: Diagnosis not present

## 2019-01-01 DIAGNOSIS — F332 Major depressive disorder, recurrent severe without psychotic features: Secondary | ICD-10-CM

## 2019-01-01 DIAGNOSIS — Z630 Problems in relationship with spouse or partner: Secondary | ICD-10-CM | POA: Diagnosis not present

## 2019-01-01 NOTE — Progress Notes (Signed)
Psychotherapy Progress Note [facility] [name], PhD LP  Patient ID: Pam Graham     MRN: [id number]     Therapy format: Individual psychotherapy Date: 01/01/2019     Start: 5:15p Stop: 6:00p Time Spent: 45 min Location: in-person   Session narrative (presenting needs, interim history, self-report of stressors and symptoms, applications of prior therapy, status changes, and interventions made in session) Urgently scheduling today.  Pam Graham broke it to her Sunday that he is going back to his wife, who has pledged to do better.  38-year relationship, grown son, Pam Graham Parkinson's all in play.  Crying and begging last 3 days, feels depression coming back up on her, fleeting thoughts of suicide without any intent, and pledges safety.  Pervasive loss of appetite, lots of "Why me?" thoughts, compulsive questions, and some fear of breaking down emotionally.  Pam Graham plans appear to be for wife to return to the home, and Pam Graham has provided for time needed to make the changes.  PT has signed on to move into apt in [place].  Has reached out to friend Pam Graham and to mother, both brothers for support and help.  Support/affirmation provided.  Affirmed that PT has been faithful both in attempting to build this relationship and in trying to get Pam Graham feelings and ways of coping clearer and healthier.  It was always possible that he would come to some kind of conscience about separating from his wife, and in treating PT more fairly, he may we'll see the need to do further with his wife, given the fairly abrupt way they made their decisions last year.  PT professes respect of marriage, even if it means she loses, and encouraged her to allow him room to work through whatever they see the need to.  Has enough time to collect her things, does not feel rushed, though the prospect of lingering in the house together until moving happens may be tense.  Admittedly some concern for money, which has  better for cohabitating but believes she is solvent without him.  Therapeutic modalities: Cognitive Behavioral Therapy and Ego-Supportive  Mental Status/Observations:  Appearance:   Casual     Behavior:  Appropriate  Motor:  Normal  Speech/Language:   Clear and Coherent  Affect:  Appropriate and Tearful  Mood:  sad and responsive  Thought process:  normal  Thought content:    WNL  Sensory/Perceptual disturbances:    WNL  Orientation:  grossly intact  Attention:  Good  Concentration:  Good  Memory:  WNL  Insight:    Good  Judgment:   Good  Impulse Control:  Good   Risk Assessment: Danger to Self: No Self-injurious Behavior: No Danger to Others: No Physical Aggression / Violence: No Duty to Warn: No Access to Firearms a concern: No  Assessment of progress:  situational setback(s)  Diagnosis:   ICD-10-CM   1. Generalized anxiety disorder  F41.1   2. Relationship problem between partners  Z63.0   3. MDD (major depressive disorder), recurrent severe, without psychosis ([place])  F33.2     Plan:  . Available for more frequent support during relationship transition . Maintain healthy contact with family/friends . Other recommendations/advice as noted above . Continue to utilize previously learned skills ad lib . Maintain medication as prescribed and work faithfully with relevant prescriber(s) if any changes are desired or seem indicated . Call the clinic on-call service, present to ER, or call 911 if any life-threatening psychiatric crisis Return in about 4 days (  around 01/05/2019).   Blanchie Serve, PhD [name], PhD LP Clinical Psychologist, [facility] [address] [place],  [address] [phone]

## 2019-01-05 ENCOUNTER — Ambulatory Visit: Payer: BC Managed Care – PPO | Admitting: Psychiatry

## 2019-01-05 ENCOUNTER — Other Ambulatory Visit: Payer: Self-pay

## 2019-01-05 DIAGNOSIS — Z630 Problems in relationship with spouse or partner: Secondary | ICD-10-CM

## 2019-01-05 DIAGNOSIS — F411 Generalized anxiety disorder: Secondary | ICD-10-CM | POA: Diagnosis not present

## 2019-01-05 DIAGNOSIS — E282 Polycystic ovarian syndrome: Secondary | ICD-10-CM

## 2019-01-05 DIAGNOSIS — F332 Major depressive disorder, recurrent severe without psychotic features: Secondary | ICD-10-CM | POA: Diagnosis not present

## 2019-01-05 DIAGNOSIS — Z87898 Personal history of other specified conditions: Secondary | ICD-10-CM

## 2019-01-05 MED ORDER — SERTRALINE HCL 50 MG PO TABS: 50.0000 mg | ORAL_TABLET | Freq: Every day | ORAL | 1 refills | Status: DC

## 2019-01-05 NOTE — Progress Notes (Signed)
Psychotherapy Progress Note [facility] [name], PhD LP  Patient ID: Pam Graham     MRN: [id number]     Therapy format: Individual psychotherapy Date: 01/05/2019     Start: 4:17p Stop: 5:05p Time Spent: 48 min Location: in-person   Session narrative (presenting needs, interim history, self-report of stressors and symptoms, applications of prior therapy, status changes, and interventions made in session) Stressful working through the move to apartment -- several loads, has family help and friend support, and Pam Graham is helping move things.  Pam Graham being a little confusing, taking her out for finer meals when she has little appetite.  Tried to lay off the needy questions, like "Why not me?" since seen on Thursday, but can still ask.  Crying often, making a few clerical mistakes.  PCOS for years, just mysteriously had her period.  Missed last three physical therapy appts for tennis elbow.  Concentration off.  Wants RX for anxiety/grief.  Hx of Xanax being part of a perfect storm that activated seizures; discussed alternatives.    Will be applying for STD/income replacement with her HR.  Discussed process and documentation required.      Advised journaling to process grief and to give herself safe expression vs. risk of blurting things out that either may make her seem unwell or undesirable or sell herself out and compound sense of being demeaned.  Cautioned against trying to compare herself to Mike's wife -- it is plainly not a competition between female partners but a matter of the former couple working out unfinished business.  OK to seek Rx from PCP if needed or to solicit from new PA picking up her case.  Meanwhile, put up something she identifies with in her apartment, try to begin putting her stamp on it, e.g., [place] memorabilia.  Press into the people she has who encourage her.    Therapeutic modalities: Cognitive Behavioral Therapy, Solution-Oriented/Positive Psychology  and Ego-Supportive  Mental Status/Observations:  Appearance:   Casual     Behavior:  Appropriate  Motor:  Psychomotor Retardation  Speech/Language:   Clear and Coherent  Affect:  Depressed and Tearful  Mood:  depressed and sad  Thought process:  normal  Thought content:    WNL  Sensory/Perceptual disturbances:    WNL  Orientation:  grossly intact  Attention:  Good  Concentration:  Fair  Memory:  WNL  Insight:    Good  Judgment:   Fair  Impulse Control:  Fair   Risk Assessment: Danger to Self: No Self-injurious Behavior: No Danger to Others: No Physical Aggression / Violence: No Duty to Warn: No Access to Firearms a concern: No  Assessment of progress:  situational setback(s)  Diagnosis:   ICD-10-CM   1. Generalized anxiety disorder  F41.1   2. MDD (major depressive disorder), recurrent severe, without psychosis ([place])  F33.2   3. Relationship problem between partners  Z63.0   4. History of seizures  Z87.898   5. PCOS (polycystic ovarian syndrome)  E28.2     Plan:  . Put up distinctive objects in apartment to claim it as new "normal" under way . Seek and allow adequate personal support from family/friends, careful not to lean on Mike . Guard against rivalry thinking and self-affirm Pam Graham is working out his own conscience . Other recommendations/advice as noted above . Continue to utilize previously learned skills ad lib . Maintain medication as prescribed and work faithfully with relevant prescriber(s) if any changes are desired or seem indicated .  Call the clinic on-call service, present to ER, or call 911 if any life-threatening psychiatric crisis Return in about 1 week (around 01/12/2019) for as available AND move up appt with TH; OK later this week.   Blanchie Serve, PhD [name], PhD LP Clinical Psychologist, [facility] [address] [place], [place] [address] [phone]

## 2019-01-07 ENCOUNTER — Other Ambulatory Visit: Payer: Self-pay

## 2019-01-07 ENCOUNTER — Telehealth: Payer: Self-pay | Admitting: Physician Assistant

## 2019-01-07 MED ORDER — HYDROXYZINE PAMOATE 25 MG PO CAPS: 25.0000 mg | ORAL_CAPSULE | Freq: Every day | ORAL | 0 refills | Status: DC | PRN

## 2019-01-07 NOTE — Telephone Encounter (Signed)
Pam Graham called to request something for her anxiety.  She was a pt of Clay's.  He had prescribed Hydroxyzine.  She didn't need to take all the time so hasn't needed refills until now.  She is struggling some now and would like a refill.  Has an appt. 8/3 which is the first available.  She has a new pharmacy - [facility] in [place]

## 2019-01-07 NOTE — Telephone Encounter (Signed)
Refill sent to pharmacy.   

## 2019-01-09 ENCOUNTER — Telehealth: Payer: Self-pay | Admitting: Psychiatry

## 2019-01-09 NOTE — Telephone Encounter (Signed)
For some reason, this message trail is not letting me reply directly.  Yes, I have the form, am filling it out, anticipate getting it out Monday.  She left some personal info out of her section, but we can cover it if she will provide it -- address, DOB, insured ID number.  Also could use clarity -- does she want to be written out completely next week, through our Thursday session?

## 2019-01-09 NOTE — Telephone Encounter (Signed)
Pam Graham stated that she probably want return to work on Monday. She is still having a lot of anxiety, depression and is sleeping a lot. She has made an appt to come in Monday in addition to her Thurs appt.

## 2019-01-09 NOTE — Telephone Encounter (Signed)
Pam Graham called again to report that her disability forms are due back by 01/16/19.  Do you have these forms?  I'm sure she'll discuss them with you on Monday.

## 2019-01-12 ENCOUNTER — Ambulatory Visit (INDEPENDENT_AMBULATORY_CARE_PROVIDER_SITE_OTHER): Payer: BC Managed Care – PPO | Admitting: Psychiatry

## 2019-01-12 ENCOUNTER — Other Ambulatory Visit: Payer: Self-pay

## 2019-01-12 DIAGNOSIS — Z630 Problems in relationship with spouse or partner: Secondary | ICD-10-CM | POA: Diagnosis not present

## 2019-01-12 DIAGNOSIS — Z87898 Personal history of other specified conditions: Secondary | ICD-10-CM

## 2019-01-12 DIAGNOSIS — E282 Polycystic ovarian syndrome: Secondary | ICD-10-CM

## 2019-01-12 DIAGNOSIS — F411 Generalized anxiety disorder: Secondary | ICD-10-CM | POA: Diagnosis not present

## 2019-01-12 DIAGNOSIS — F332 Major depressive disorder, recurrent severe without psychotic features: Secondary | ICD-10-CM

## 2019-01-12 NOTE — Progress Notes (Signed)
Psychotherapy Progress Note [facility] [name], PhD LP  Patient ID: Pam Graham     MRN: [id number]     Therapy format: Individual psychotherapy Date: 01/12/2019     Start: 11:13a Stop: 12:20p Time Spent: 67 min Location: in-person   Session narrative (presenting needs, interim history, self-report of stressors and symptoms, applications of prior therapy, status changes, and interventions made in session) Very torn up now, tearful, low energy, anhedonic, no appetite, nauseated, oversleeping, isolative, and unable to work.  July 5 Pam Graham told her he was going back to his wife.  PT moved into apartment 7/14, Pam Graham thought his wife was moving back but Friday (3 days ago) she told him she wasn't, and he says he's a fool wants her to move back in.    Lengthy STD form completed in session.  WHODAS 2.0 (36) score of 41, with very high score in life activities, as affected emotional distress.    Therapeutic modalities: Cognitive Behavioral Therapy, Solution-Oriented/Positive Psychology and Ego-Supportive  Mental Status/Observations:  Appearance:   Well Groomed     Behavior:  Appropriate  Motor:  Normal  Speech/Language:   Clear and Coherent  Affect:  Tearful  Mood:  depressed and responsive to humor  Thought process:  some blocking  Thought content:    Rumination  Sensory/Perceptual disturbances:    WNL  Orientation:  grossly intact  Attention:  Fair  Concentration:  Fair  Memory:  grossly intact  Insight:    Good  Judgment:   Fair  Impulse Control:  Fair   Risk Assessment: Danger to Self: No Self-injurious Behavior: No Danger to Others: No Physical Aggression / Violence: No Duty to Warn: No Access to Firearms a concern: No  Assessment of progress:  stabilized but partly disabled due to emotional distress and acute grieving  Diagnosis: No diagnosis found.  Plan:  . Try to eat more often, small amounts as tolerated . Do not make any commitment to move  back in right now . Represent to Pam Graham the need to have control over how much or how fast she hears what is going on with [facility] -- if does not want to hear it, will not get volunteered on her . Journal liberally, either on paper or digitally, as moved . Make use of supportive family (mother, sister-in-law Pam Graham) and friends Pam Graham, Pam Graham); as needed, ask to hold off angry comments on he behalf. . See through the logistics of moving in and establishing utilities and work capability . Part-time est RTW next Monday; full-time est. Wed 8/5 . Other recommendations/advice as noted above . Continue to utilize previously learned skills ad lib . Maintain medication as prescribed and work faithfully with relevant prescriber(s) if any changes are desired or seem indicated.  Med check 8/3 @ 4pm. . Call the clinic on-call service, present to ER, or call 911 if any life-threatening psychiatric crisis Return in about 3 days (around 01/15/2019) for as scheduled already; make 1 for next week, and weekly for a few after that, if able.   Blanchie Serve, PhD [name], PhD LP Clinical Psychologist, [facility] Group [facility] [address] [place], [place] [address] [phone]

## 2019-01-15 ENCOUNTER — Other Ambulatory Visit: Payer: Self-pay

## 2019-01-15 ENCOUNTER — Ambulatory Visit (INDEPENDENT_AMBULATORY_CARE_PROVIDER_SITE_OTHER): Payer: BC Managed Care – PPO | Admitting: Psychiatry

## 2019-01-15 DIAGNOSIS — F411 Generalized anxiety disorder: Secondary | ICD-10-CM

## 2019-01-15 DIAGNOSIS — Z630 Problems in relationship with spouse or partner: Secondary | ICD-10-CM | POA: Diagnosis not present

## 2019-01-15 DIAGNOSIS — F3341 Major depressive disorder, recurrent, in partial remission: Secondary | ICD-10-CM

## 2019-01-15 NOTE — Progress Notes (Signed)
Psychotherapy Progress Note [facility] [name], PhD LP  Patient ID: Pam Graham     MRN: [id number]     Therapy format: Individual psychotherapy Date: 01/15/2019     Start: 4:10p Stop: 5:00p Time Spent: 50 min Location: in-person   Session narrative (presenting needs, interim history, self-report of stressors and symptoms, applications of prior therapy, status changes, and interventions made in session) Feels ready to RTW part-time.  Tears letting up, getting things settled in apartment, realizing better that she is not and does not have to be desperate, just has deeply wanted this relationship and wanted it to work.  Further relieved by conversation today informing her that [name] and Carmie Kanner had an open heart-to-heart last night and soberly agreed they could not see themselves rebuilding a marriage that has been on the rocks for 15 years and has taken the turns it has while married and during separation.  Reviewed messages given, trust in what she hears, and principles making sense to PT, resolved that it is safe to continue the relationship with [name] while maintaining separate households, to date, as seriously as they please, without rushing or depending, and in a way that lets [name] continue to make sense of the breakdown of his marriage without walling off in resentment or resorting to sexual addiction to cope with his age, health problems, loss of career, loss of family structure, or growing pains in relationship to PT.    Personality conflict with supervisor Berline Chough), alleged to micromanage and makes nervously self-serving references, at least compared to previous supervisor Cloyde Reams), who expressed and demonstrated confidence in workers.  [place] apparently barraged her with information early in this leave, annoying.  Support provided, recommend practicing detachment, possibly imagine overage of information as a risen creek, herself sitting on the bank watching the flow go  by..  Therapeutic modalities: Cognitive Behavioral Therapy, Assertiveness/Communication, Solution-Oriented/Positive Psychology and Interpersonal  Mental Status/Observations:  Appearance:   Casual     Behavior:  Appropriate  Motor:  Normal  Speech/Language:   Clear and Coherent  Affect:  Appropriate and broad, responsive  Mood:  much-improved  Thought process:  normal  Thought content:    WNL  Sensory/Perceptual disturbances:    WNL  Orientation:  none stated  Attention:  Good  Concentration:  Good  Memory:  WNL  Insight:    Good  Judgment:   Good  Impulse Control:  Good   Risk Assessment: Danger to Self: No Self-injurious Behavior: No Danger to Others: No Physical Aggression / Violence: No Duty to Warn: No Access to Firearms a concern: No  Assessment of progress:  progressing well  Diagnosis:   ICD-10-CM   1. Recurrent major depressive disorder, in partial remission ([place])  F33.41   2. Generalized anxiety disorder  F41.1   3. Relationship problem between partners  Z63.0     Plan:  . Endorse living single and dating as desired . Self-affirm that [name] is most likely honestly working through now important aspects of his separation/divorce and reckoning with his own conscience, and that taking things at a pace, both free to decide pace, commitments, next moves is best . Self-affirm that she is OK, can want approval, attention, affection but need not be desperate . RTW part-time as scheduled Monday, full-time Wed the week after.  Note written for HR, staff to fax . Other recommendations/advice as noted above . Continue to utilize previously learned skills ad lib . Maintain medication as prescribed and work faithfully with  relevant prescriber(s) if any changes are desired or seem indicated . Call the clinic on-call service, present to ER, or call 911 if any life-threatening psychiatric crisis Return in about 1 week (around 01/22/2019) for as scheduled already.   Blanchie Serve, PhD [name], PhD LP Clinical Psychologist, [facility] [address] [place], [place] [address] [phone]

## 2019-01-20 ENCOUNTER — Ambulatory Visit (INDEPENDENT_AMBULATORY_CARE_PROVIDER_SITE_OTHER): Payer: BC Managed Care – PPO | Admitting: Psychiatry

## 2019-01-20 ENCOUNTER — Other Ambulatory Visit: Payer: Self-pay

## 2019-01-20 ENCOUNTER — Ambulatory Visit: Payer: BC Managed Care – PPO | Admitting: Psychiatry

## 2019-01-20 DIAGNOSIS — Z630 Problems in relationship with spouse or partner: Secondary | ICD-10-CM | POA: Diagnosis not present

## 2019-01-20 DIAGNOSIS — F411 Generalized anxiety disorder: Secondary | ICD-10-CM

## 2019-01-20 DIAGNOSIS — F3341 Major depressive disorder, recurrent, in partial remission: Secondary | ICD-10-CM | POA: Diagnosis not present

## 2019-01-20 NOTE — Progress Notes (Signed)
Psychotherapy Progress Note [facility] [name], PhD LP  Patient ID: Pam Graham     MRN: [id number]     Therapy format: Individual psychotherapy Date: 01/20/2019     Start: 1:11p Stop: 2:12p Time Spent: 45 min (remainder donated) Location: in-person   Session narrative (presenting needs, interim history, self-report of stressors and symptoms, applications of prior therapy, status changes, and interventions made in session) Technically started work yesterday, but technologically unable to do much, as her apartment's cable service is glitching.  Management is caught up with the conditions she is dealing with.  Income replacement claim went through smoothly.     Wants to deal with friends' opinions.  Pam Graham, communicated by text, taking a hard line against Pam Graham (based on hx of sexting and grouchiness), encouraging and helpful with the move, got to see her torn up at the worst of times.  Discussed insights so far about the need to get her identity firmed up again after spending so much time responding to Pam Graham's moods, reacquainted with her own value.  Brother Pam Graham sent her a meme "Never beg."  Advised best ask her support people if they have any particular meaning, fears to take note of, and be ready to let them know she is stronger than she was and ask them to trust her discretion to work out the complexities of her own relationship, since she may be stronger and Pam Graham clearer than they were in crisis earlier.  Current situation at [facility], Asperger's son Pam Graham moved in on an ad lib basis.  Hx of friction with PT, basically over customs in Pam Graham's house, never any yelling, just got very rigid as things got changed when she moved in last year.  Advised Graham is to ask Pam Graham, as the father and head of household, if he has any needs or advice for how she would interact with his son, whom he knows much better.  Provided insights on dealing with Asperger's.  Therapeutic modalities:  Cognitive Behavioral Therapy and Solution-Oriented/Positive Psychology  Mental Status/Observations:  Appearance:   Casual, Well Groomed and brightly dressed     Behavior:  Appropriate  Motor:  Normal  Speech/Language:   Clear and Coherent  Affect:  Appropriate and reasonably broad  Mood:  normal  Thought process:  normal  Thought content:    WNL  Sensory/Perceptual disturbances:    WNL  Orientation:  grossly intact  Attention:  Good  Concentration:  Good  Memory:  WNL  Insight:    Good  Judgment:   Good  Impulse Control:  Good   Risk Assessment: Danger to Self: No Self-injurious Behavior: No Danger to Others: No Physical Aggression / Violence: No Duty to Warn: No Access to Firearms a concern: No  Assessment of progress:  progressing well  Diagnosis:   ICD-10-CM   1. Recurrent major depressive disorder, in partial remission ([place])  F33.41   2. Generalized anxiety disorder  F41.1    stable  3. Relationship problem between partners  Z63.0    improving   Plan:  . Maintain separate residence and work from there - agreed . Posture toward protective supporters to thank for concern and ask what they would consider warning signs and/or alarmingly bad news ("customer service" conversations) . Other recommendations/advice as noted above . Continue to utilize previously learned skills ad lib . Maintain medication as prescribed and work faithfully with relevant prescriber(s) if any changes are desired or seem indicated . Call the clinic on-call service, present  to ER, or call 911 if any life-threatening psychiatric crisis Return for as scheduled already.  Blanchie Serve, PhD [name], PhD LP Clinical Psychologist, [facility] [address], [place], [place] [address] [phone]

## 2019-01-26 ENCOUNTER — Other Ambulatory Visit: Payer: Self-pay

## 2019-01-26 ENCOUNTER — Ambulatory Visit (INDEPENDENT_AMBULATORY_CARE_PROVIDER_SITE_OTHER): Payer: BC Managed Care – PPO | Admitting: Physician Assistant

## 2019-01-26 ENCOUNTER — Encounter: Payer: Self-pay | Admitting: Physician Assistant

## 2019-01-26 DIAGNOSIS — Z87898 Personal history of other specified conditions: Secondary | ICD-10-CM

## 2019-01-26 DIAGNOSIS — F411 Generalized anxiety disorder: Secondary | ICD-10-CM

## 2019-01-26 DIAGNOSIS — F3341 Major depressive disorder, recurrent, in partial remission: Secondary | ICD-10-CM | POA: Diagnosis not present

## 2019-01-26 MED ORDER — SERTRALINE HCL 50 MG PO TABS: 50.0000 mg | ORAL_TABLET | Freq: Every day | ORAL | 0 refills | Status: DC

## 2019-01-26 NOTE — Progress Notes (Signed)
[facility]  Patient ID: Pam Graham,  MRN: [id number]  PCP: Tobin Chad, MD  Date of Evaluation: 01/26/2019 Time spent:15 minutes  Chief Complaint:  Chief Complaint    Anxiety; Follow-up     Virtual Visit via Telephone Note  I connected with patient by a video enabled telemedicine application or telephone, with their informed consent, and verified patient privacy and that I am speaking with the correct person using two identifiers.  I am private, in my home and the patient is home.   I discussed the limitations, risks, security and privacy concerns of performing an evaluation and management service by telephone and the availability of in person appointments. I also discussed with the patient that there may be a patient responsible charge related to this service. The patient expressed understanding and agreed to proceed.   I discussed the assessment and treatment plan with the patient. The patient was provided an opportunity to ask questions and all were answered. The patient agreed with the plan and demonstrated an understanding of the instructions.   The patient was advised to call back or seek an in-person evaluation if the symptoms worsen or if the condition fails to improve as anticipated.  I provided 15  minutes of non-face-to-face time during this encounter.  HISTORY/CURRENT STATUS: HPI for routine med check.  Patient is transferring to my care after her provider and my colleague, Comer Locket, PA passed away.  Pam Graham is seen for medication management today for anxiety.  She gives a brief history of having been admitted to [facility] a couple of years ago for depression.  She was originally put on a different SSRI but then changed to Zoloft which she has been on ever since.  States it has been working very well.  Recently however she has had some circumstances that have caused more anxiety.  She called in about 3 weeks ago and we added hydroxyzine  which has been helpful as well.  It does not cause extreme drowsiness or other side effects.  She has not needed it but a few times in the past 2 weeks.  States that her circumstances are improving and she feels that she is getting better.  She sleeps well and no longer takes trazodone.  Patient denies loss of interest in usual activities and is able to enjoy things.  Denies decreased energy or motivation.  Appetite has not changed.  No extreme sadness, tearfulness, or feelings of hopelessness.  Denies any changes in concentration, making decisions or remembering things.  Denies suicidal or homicidal thoughts.  Denies dizziness, syncope, seizures, numbness, tingling, tremor, tics, unsteady gait, slurred speech, confusion. Denies muscle or joint pain, stiffness, or dystonia.  Individual Medical History/ Review of Systems: Changes? :No    Past medications for mental health diagnoses include: Prozac, trazodone  Allergies: Iohexol, Penicillins, and Clindamycin  Current Medications:  Current Outpatient Medications:  .  cetirizine (ZYRTEC) 10 MG tablet, Take 10 mg by mouth daily., Disp: , Rfl:  .  hydrOXYzine (VISTARIL) 25 MG capsule, Take 1 capsule (25 mg total) by mouth daily as needed., Disp: 30 capsule, Rfl: 0 .  norethindrone-ethinyl estradiol-iron (MICROGESTIN FE,GILDESS FE,LOESTRIN FE) 1.5-30 MG-MCG tablet, Take 1 tablet by mouth at bedtime., Disp: , Rfl:  .  sertraline (ZOLOFT) 50 MG tablet, Take 1 tablet (50 mg total) by mouth daily., Disp: 90 tablet, Rfl: 0 .  glycopyrrolate (ROBINUL-FORTE) 2 MG tablet, Take 1 tablet (2 mg total) by mouth 2 (two) times daily. (Patient  not taking: Reported on 01/26/2019), Disp: 60 tablet, Rfl: 11 .  levETIRAcetam (KEPPRA XR) 500 MG 24 hr tablet, Take 2 tablets (1,000 mg total) by mouth at bedtime. (Patient not taking: Reported on 08/06/2018), Disp: 60 tablet, Rfl: 0 Medication Side Effects: none  Family Medical/ Social History: Changes? No  MENTAL HEALTH  EXAM:  There were no vitals taken for this visit.There is no height or weight on file to calculate BMI.  General Appearance: unable to assess  Eye Contact:  unable to assess  Speech:  Clear and Coherent  Volume:  Normal  Mood:  Euthymic  Affect:  unable to assess  Thought Process:  Goal Directed  Orientation:  Full (Time, Place, and Person)  Thought Content: Logical   Suicidal Thoughts:  No  Homicidal Thoughts:  No  Memory:  WNL  Judgement:  Good  Insight:  Good  Psychomotor Activity:  unable to assess  Concentration:  Concentration: Good  Recall:  Good  Fund of Knowledge: Good  Language: Good  Assets:  Desire for Improvement  ADL's:  Intact  Cognition: WNL  Prognosis:  Good    DIAGNOSES:    ICD-10-CM   1. Generalized anxiety disorder  F41.1   2. Recurrent major depressive disorder, in partial remission ([place])  F33.41   3. History of seizures  Z87.898     Receiving Psychotherapy: Yes with Dr. [name]   RECOMMENDATIONS:  We discussed the option of increasing the Zoloft.  We both agreed to leave it the same for now but if she is needing to take the hydroxyzine more than a couple of days a week, it would probably be helpful to increase Zoloft for prevention of anxiety.  She can call and we can do that over the phone between visits. Continue Zoloft 50 mg p.o. daily. Continue hydroxyzine 25 mg daily as needed. Continue psychotherapy with Dr. Ina Homes. Return in 3 months.  Donnal Moat, PA-C   This record has been created using [employer].  Chart creation errors have been sought, but may not always have been located and corrected. Such creation errors do not reflect on the standard of medical care.

## 2019-01-28 ENCOUNTER — Ambulatory Visit (INDEPENDENT_AMBULATORY_CARE_PROVIDER_SITE_OTHER): Payer: BC Managed Care – PPO | Admitting: Psychiatry

## 2019-01-28 ENCOUNTER — Other Ambulatory Visit: Payer: Self-pay

## 2019-01-28 DIAGNOSIS — E282 Polycystic ovarian syndrome: Secondary | ICD-10-CM

## 2019-01-28 DIAGNOSIS — F411 Generalized anxiety disorder: Secondary | ICD-10-CM | POA: Diagnosis not present

## 2019-01-28 DIAGNOSIS — F3341 Major depressive disorder, recurrent, in partial remission: Secondary | ICD-10-CM | POA: Diagnosis not present

## 2019-01-28 DIAGNOSIS — Z630 Problems in relationship with spouse or partner: Secondary | ICD-10-CM | POA: Diagnosis not present

## 2019-01-28 DIAGNOSIS — Z87898 Personal history of other specified conditions: Secondary | ICD-10-CM

## 2019-01-28 NOTE — Progress Notes (Signed)
Psychotherapy Progress Note [facility] Pam Moore, PhD LP  Patient ID: Pam Graham     MRN: [id number]     Therapy format: Individual psychotherapy Date: 01/28/2019     Start: 2:15p Stop: 3:00p Time Spent: 45 min Location: in-person   Session narrative (presenting needs, interim history, self-report of stressors and symptoms, applications of prior therapy, status changes, and interventions made in session) Had transfer med check with Pam Graham 2 days ago.  Sparing use of hydroxyzine, increased Zoloft offered but considered necessary.  Very good rapport, much reassured and impressed, even by phone only.  Re. 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  Full-time starting tomorrow.  One challenging and lengthy call this morning with a patient who kept interrupting and going off-topic.  More frustrated today than normally would be -- explained as  context effect of PT being held back, longer than expected, from the chance to see herself in normal action at work.  Meanwhile, Pam Graham is having conflict with 16WV son Pam Graham, who has been living consistently with Pam Graham, and his Asperger's traits and taking advantage.  PT facing impatience to be free to get back in the house, not to live, but to hang out.  Advised empathize with Pam Graham, knowing he's squeamish about conflict, and be willing to ask not so much when but how they would tell it's OK to start coming around.    Therapeutic modalities: Assertiveness/Communication, Solution-Oriented/Positive Psychology and Psycho-education/Bibliotherapy  Mental Status/Observations:  Appearance:   Casual and Neat     Behavior:  Appropriate  Motor:  Normal  Speech/Language:   Clear and Coherent  Affect:  bright  Mood:  normal  Thought process:  normal  Thought content:    WNL  Sensory/Perceptual disturbances:    WNL  Orientation:  grossly intact  Attention:  Good  Concentration:  Good  Memory:  WNL  Insight:     Good  Judgment:   Good  Impulse Control:  Good   Risk Assessment: Danger to Self: No Self-injurious Behavior: No Danger to Others: No Physical Aggression / Violence: No Duty to Warn: No Access to Firearms a concern: No  Assessment of progress:  progressing well  Diagnosis:   ICD-10-CM   1. Generalized anxiety disorder  F41.1   2. Recurrent major depressive disorder, in partial remission ([place])  F33.41   3. History of seizures  Z87.898   4. Relationship problem between partners  Z63.0   5. PCOS (polycystic ovarian syndrome)  E28.2    Plan:  . Self-affirm OK to feel the wish for back to normal, in work and in love, OK to need time yet . OK to RTW full-time . Communication tips for situation with Pam Graham -- empathy first, how > when . Other recommendations/advice as noted above . Continue to utilize previously learned skills ad lib . Maintain medication as prescribed and work faithfully with relevant prescriber(s) if any changes are desired or seem indicated . Call the clinic on-call service, present to ER, or call 911 if any life-threatening psychiatric crisis Return in about 2 weeks (around 02/11/2019) for in-person.  Pam Serve, PhD Pam Moore, PhD LP Clinical Psychologist, [facility] [address] [place], [place] [address] [phone]

## 2019-01-30 ENCOUNTER — Encounter (HOSPITAL_COMMUNITY): Payer: Self-pay

## 2019-01-30 ENCOUNTER — Ambulatory Visit (HOSPITAL_COMMUNITY): Payer: BC Managed Care – PPO | Attending: Orthopedic Surgery

## 2019-01-30 ENCOUNTER — Ambulatory Visit: Payer: BC Managed Care – PPO | Admitting: Psychiatry

## 2019-01-30 ENCOUNTER — Other Ambulatory Visit: Payer: Self-pay

## 2019-01-30 DIAGNOSIS — R29898 Other symptoms and signs involving the musculoskeletal system: Secondary | ICD-10-CM | POA: Insufficient documentation

## 2019-01-30 DIAGNOSIS — M25521 Pain in right elbow: Secondary | ICD-10-CM

## 2019-01-30 NOTE — Patient Instructions (Signed)
DIPS IN CHAIR  While sitting in a chair with arm rests, push yourself upawards so that you lift your buttocks of the chair. Then lower down controlled back to normal seated position.   If you are unable to lift yourself up, you can perform "pressure releases" so that you simply push to take some weight off your buttocks.  10X   BENT OVER TRICEPS - TRICEP KICKBACKS  While standing, bend over and support your self with your uninjured arm. With your affected arm and elbow at your side, extend your elbow as you straighten your arm as shown.   Keep your elbow at your side then entire time.    10X    Assisted Triceps Stretch  Stand and hold tool (towel, rope, band, etc.) in the hand of the arm that will be stretched. Extend arm straight upwards above the head, and then flex at the elbow so that the hand is behind neck and elbow is pointing upwards. Allow for the tool to hang from the active arm and use the opposite hand to reach for the tool and gently pull down so that it pulls active arm's elbow down. Hold for 30 seconds complete 2 times.   Targeted muscles: Triceps brachii, Lats, Terest major/minor.     ELASTIC BAND TRICEP EXTENSIONS  Start with your elbow bent and holding an elastic band as shown. Pull the elastic band downward as you extend your elbow.   Keep your elbow by your side the entire time.  10X

## 2019-01-30 NOTE — Therapy (Addendum)
[place], [place], [address] Phone: [phone]   Fax:  [phone]  Occupational Therapy Evaluation  Patient Details  Name: Pam Graham MRN: [id number] Date of Birth: 01/27/1971 Referring Provider (OT): Dorna Leitz, MD   Encounter Date: 01/30/2019  OT End of Session - 01/30/19 1237    Visit Number  1    Number of Visits  8    Date for OT Re-Evaluation  02/27/19    Authorization Type  [employer] $40 copay    Authorization Time Period  60 visit limit for PT/SLP/OT 1 used    Authorization - Visit Number  2    Authorization - Number of Visits  25    OT Start Time  0818    OT Stop Time  0908    OT Time Calculation (min)  50 min    Activity Tolerance  Patient tolerated treatment well    Behavior During Therapy  [facility] for tasks assessed/performed       Past Medical History:  Diagnosis Date  . Anal fissure   . Anxiety   . Deafness in right ear    congenital  . Depression   . Erythrodermic psoriasis   . Fatty liver 12/26/11  . Hypertension   . IBS (irritable bowel syndrome)   . Ovarian cyst   . PAC (premature atrial contraction) 04/02/08  . PCOS (polycystic ovarian syndrome)   . PVC (premature ventricular contraction) 04/02/08  . Seizure disorder ([place])   . Tubular adenoma of colon     Past Surgical History:  Procedure Laterality Date  . ABDOMINOPLASTY    . COLONOSCOPY    . CYSTOSCOPY    . LIPOSUCTION      There were no vitals filed for this visit.  Subjective Assessment - 01/30/19 0824    Subjective   S: The steriod injections helped for a week but then the pain came back.    Pertinent History  Patient is a 48 y/o female S/P right elbow pain which has been ongoing since January 2020. Patient reports at that time she begain to complete her job at home and was using the computer more than normal. She did receive 2 PT sessions at [facility] although recently moved to [place] and wished  to continue with therapy here. Dr. Berenice Primas has referred patient to occupational therapy for evaluation and treatment.    Special Tests  FOTO next session.    Patient Stated Goals  To decrease pain in her elbow.    Currently in Pain?  Yes    Pain Score  4    6/10 with use   Pain Location  Elbow    Pain Orientation  Right;Lateral    Pain Descriptors / Indicators  Aching    Pain Type  Chronic pain    Pain Radiating Towards  occassionally will radiate down the forearm.    Pain Onset  More than a month ago    Pain Frequency  Constant    Aggravating Factors   increased use and gripping tasks. typing. gripping glass.    Pain Relieving Factors  iontophoresis at previous PT session.    Effect of Pain on Daily Activities  min effect    Multiple Pain Sites  No        [facility] OT Assessment - 01/30/19 0827      Assessment   Medical Diagnosis  right tennis elbow    Referring Provider (OT)  Dorna Leitz,  MD    Onset Date/Surgical Date  --   january 2020   Hand Dominance  Left    Next MD Visit  None scheduled     Prior Therapy  2 sessions of PT at [facility]      Precautions   Precautions  None      Restrictions   Weight Bearing Restrictions  No      Balance Screen   Has the patient fallen in the past 6 months  No      Home  Environment   Family/patient expects to be discharged to:  Private residence      Prior Function   Level of Independence  Independent    Vocation  Full time employment    Loss adjuster, [employer]. Works with patient with a Parkinson's diagnosis. Works from home. Phone calls and on the computer      ADL   ADL comments  Pain/discomfort with gripping and holding onto objects. Increased pain with typing and computer mouse use.       Mobility   Mobility Status  Independent      Written Expression   Dominant Hand  Left      Vision - History   Baseline Vision  No visual deficits      Observation/Other Assessments   Focus on Therapeutic Outcomes (FOTO)    Complete next session      ROM / Strength   AROM / PROM / Strength  AROM;PROM;Strength      Palpation   Palpation comment  trigger point and moderate fascial restrictions noted in brachioradialis muscle region superior to elbow joint.       AROM   Overall AROM   Within functional limits for tasks performed    Overall AROM Comments  Patient has full A/ROM of her right shoulder, elbow, wrist, and hand.       PROM   Overall PROM   Within functional limits for tasks performed      Strength   Overall Strength  Within functional limits for tasks performed    Overall Strength Comments  patient has 5/5 strength in right wrist and elbow     Pain elicit during resisted wrist extension and resisted radial deviation.  Lateral epicondylosis testing: resisted index finger tested with mild pain noted.                  OT Education - 01/30/19 1014    Education Details  HEP focused on elbow/tricep pain. Discussed evaluation findings and goals for therapy.    Person(s) Educated  Patient    Methods  Explanation;Demonstration;Handout;Verbal cues    Comprehension  Verbalized understanding;Returned demonstration       OT Short Term Goals - 01/30/19 1243      OT SHORT TERM GOAL #1   Title  Patient will be educated and independent with her HEP in order to faciliate her progress in therapy and allow her to complete her work activities with less discomfort and pain in her right elbow.    Time  4    Period  Weeks    Status  New    Target Date  02/27/19      OT SHORT TERM GOAL #2   Title  Patient will report a decrease in elbow pain of approximately 3/10 or less with increased use and while completing her computer work for work.    Time  4    Period  Weeks    Status  New      OT SHORT TERM GOAL #3   Title  Patient will demonstrate decreased fascial restrictions of trace amount in her right elbow in order to decrease her pain level while gripping and holding objects for an extended  amount of time.    Time  4    Period  Weeks    Status  New               Plan - 01/30/19 1238    Clinical Impression Statement  A: patient is a 47 y/o female S/P right elbow pain which as been ongoing since January 2020. During evaluation, patient did not demonstrate any pain in right elbow lateral epidondyle region which is typical for tennis elbow. She did demonstrate pain and tenderness superior to the elbow joint at the brachioradialis muscle region which may indicate tendonosis.    OT Occupational Profile and History  Detailed Assessment- Review of Records and additional review of physical, cognitive, psychosocial history related to current functional performance    Occupational performance deficits (Please refer to evaluation for details):  ADL's;Work    Body Structure / Function / Physical Skills  ADL;Fascial restriction;Strength;Pain;UE functional use    Rehab Potential  Excellent    Clinical Decision Making  Limited treatment options, no task modification necessary    Comorbidities Affecting Occupational Performance:  None    Modification or Assistance to Complete Evaluation   No modification of tasks or assist necessary to complete eval    OT Frequency  2x / week    OT Duration  4 weeks    OT Treatment/Interventions  Self-care/ADL training;Therapeutic exercise;Splinting;Manual Therapy;Neuromuscular education;Ultrasound;Iontophoresis;Therapeutic activities;Cryotherapy;Electrical Stimulation;Moist Heat;Passive range of motion;Patient/family education    Plan  P: Patient will benefit from skilled OT services to increase functional performance and allow her to use her RUE with increased comfort and less elbow pain. Treatment Plan: Myofascial release, Exercises focused on loading the elbow/tricep. Eccentric loading with minimal pain. Next session: Complete FOTO. Test grip and pinch and make a goal if needed. Test: resisted forearm supination. + elicits pain at the radial tunnel. Test:  Scratch collapse test. If there is a sudden loss of strength: radial tunnel issue. Test strength in scapular stabilizers due to recent increase in desk work at home.    Consulted and Agree with Plan of Care  Patient       Patient will benefit from skilled therapeutic intervention in order to improve the following deficits and impairments:   Body Structure / Function / Physical Skills: ADL, Fascial restriction, Strength, Pain, UE functional use       Visit Diagnosis: 1. Pain in right elbow   2. Other symptoms and signs involving the musculoskeletal system       Problem List Patient Active Problem List   Diagnosis Date Noted  . MDD (major depressive disorder), recurrent severe, without psychosis ([place]) 11/02/2015  . SYNCOPE 02/15/2010  . POLYCYSTIC OVARIAN DISEASE 02/14/2010  . GRAND MAL SEIZURE 02/14/2010  . [facility] 02/14/2010  . PREMATURE VENTRICULAR CONTRACTIONS 02/14/2010  . OTHER PSORIASIS AND SIMILAR DISORDERS 02/14/2010  . DEAFNESS, CONGENITAL 02/14/2010  . HEART MURMUR, BENIGN 02/14/2010   Ailene Ravel, OTR/L,CBIS  [phone]  01/30/2019, 12:46 PM  [place] [address] [place], [place], [address] Phone: [phone]   Fax:  [phone]  Name: Pam Graham MRN: [id number] Date of Birth: 05-20-1971

## 2019-02-02 NOTE — Addendum Note (Signed)
Addended by: Ailene Ravel D on: 02/02/2019 10:59 AM   Modules accepted: Orders

## 2019-02-03 ENCOUNTER — Encounter (HOSPITAL_COMMUNITY): Payer: Self-pay | Admitting: Specialist

## 2019-02-03 ENCOUNTER — Ambulatory Visit (HOSPITAL_COMMUNITY): Payer: BC Managed Care – PPO | Admitting: Specialist

## 2019-02-03 ENCOUNTER — Other Ambulatory Visit: Payer: Self-pay

## 2019-02-03 DIAGNOSIS — M25521 Pain in right elbow: Secondary | ICD-10-CM

## 2019-02-03 DIAGNOSIS — R29898 Other symptoms and signs involving the musculoskeletal system: Secondary | ICD-10-CM | POA: Diagnosis not present

## 2019-02-03 NOTE — Patient Instructions (Signed)
Ice Massage and Cold Packs Home Program  Ice and cold packs are used so that we can return the muscle to it's natural resting state without causing more pain, which can lead to more spasm, etc.  Icing and cold packs are also used to reduce swelling, which can lead to pain and stiffness.  COLD PACKS Cold packs should be placed circumferentially around the swollen area (ie., the wrist, etc.).  A paper towel can be placed over the area before the cold pack is applied.  A towel may be wrapped around the outside of the cold pack to keep the cold in.  The swollen area should be elevated with the cold pack, if possible.  ICE MASSAGE Fill a 4 ounce paper cup three-quarters full and put it in a freezer until it is frozen.  When ready to use, tear off about 1 inch of the cup so that some of the ice is showing while the bottom of the cup can be used to hold onto. Massage the entire muscle area as instructed by your therapist.  You may use circular or up and down strokes, but do not hold the ice in one spot.  Four phases to the ice massage and cold pack application: 1. Cold: which you feel when you first apply the ice. 2. Ache: after a few minutes 3. Burning: after approximately five minutes, it will feel like your skin is burning.  At this point, remove the ice for a minute or so. 4. Numbness: THIS IS THE CRUCIAL PHASE!!! Return the ice or cold pack and massage until all the burning disappears.  This signals the end of cryotherapy.  The entire procedure should take ten to fifteen minutes while using the cold pack.  Do Not perform the ice massage for more than seven minutes on a small area or more than ten minutes on a large area.  Cryotherapy should be performed after exercises, when edema occurs, or when an area is painful. 

## 2019-02-03 NOTE — Therapy (Signed)
[place], [place], [address] Phone: [phone]   Fax:  [phone]  Occupational Therapy Treatment  Patient Details  Name: Pam Graham MRN: [id number] Date of Birth: [birth year] Referring Provider (OT): Dorna Leitz, MD   Encounter Date: 02/03/2019  OT End of Session - 02/03/19 1642    Visit Number  2    Number of Visits  8    Date for OT Re-Evaluation  02/27/19    Authorization Type  [employer] $40 copay    Authorization Time Period  60 visit limit for PT/SLP/OT 1 used    Authorization - Visit Number  3    Authorization - Number of Visits  23    OT Start Time  1515    OT Stop Time  1615    OT Time Calculation (min)  60 min    Activity Tolerance  Patient tolerated treatment well    Behavior During Therapy  [facility] for tasks assessed/performed       Past Medical History:  Diagnosis Date  . Anal fissure   . Anxiety   . Deafness in right ear    congenital  . Depression   . Erythrodermic psoriasis   . Fatty liver 12/26/11  . Hypertension   . IBS (irritable bowel syndrome)   . Ovarian cyst   . PAC (premature atrial contraction) 04/02/08  . PCOS (polycystic ovarian syndrome)   . PVC (premature ventricular contraction) 04/02/08  . Seizure disorder ([place])   . Tubular adenoma of colon     Past Surgical History:  Procedure Laterality Date  . ABDOMINOPLASTY    . COLONOSCOPY    . CYSTOSCOPY    . LIPOSUCTION      There were no vitals filed for this visit.  Subjective Assessment - 02/03/19 1641    Subjective   S:  I did the exercises.  It has been hurting some.    Currently in Pain?  Yes    Pain Score  4     Pain Location  Elbow    Pain Orientation  Right;Lateral    Pain Descriptors / Indicators  Aching         [facility] OT Assessment - 02/03/19 0001      Assessment   Medical Diagnosis  right tennis elbow    Referring Provider (OT)  Dorna Leitz, MD               OT  Treatments/Exercises (OP) - 02/03/19 0001      Exercises   Exercises  Elbow      Elbow Exercises   Elbow Extension  Strengthening;20 reps    Bar Weights/Barbell (Elbow Extension)  1 lb    Other elbow exercises  shoulder extension with 1# 10 times       Modalities   Modalities  Iontophoresis      Iontophoresis   Type of Iontophoresis  Dexamethasone    Location  right superior elbow     Dose  2 cc 4.0 mA     Time  10 minutes      Manual Therapy   Manual Therapy  Myofascial release    Manual therapy comments  manual therapy intervention completed seperately from all other intervention this date.      Myofascial Release  myofascial release to right extensor forearm, lateral epicondyle region, and upper arm, superior to elbow along brachioradialis.  OT Education - 02/03/19 1642    Education Details  educated patient on ice massage for right elbow region.  educated patient on effects of iontophoresis and recommended continuation of use of medication pads for an additional 3 hours this evening.    Person(s) Educated  Patient    Methods  Explanation;Demonstration    Comprehension  Verbalized understanding;Returned demonstration       OT Short Term Goals - 02/03/19 1646      OT SHORT TERM GOAL #1   Title  Patient will be educated and independent with her HEP in order to faciliate her progress in therapy and allow her to complete her work activities with less discomfort and pain in her right elbow.    Time  4    Period  Weeks    Status  On-going    Target Date  02/27/19      OT SHORT TERM GOAL #2   Title  Patient will report a decrease in elbow pain of approximately 3/10 or less with increased use and while completing her computer work for work.    Time  4    Period  Weeks    Status  On-going      OT SHORT TERM GOAL #3   Title  Patient will demonstrate decreased fascial restrictions of trace amount in her right elbow in order to decrease her pain level while  gripping and holding objects for an extended amount of time.    Time  4    Period  Weeks    Status  On-going               Plan - 02/03/19 1643    Clinical Impression Statement  A:  Assessed grip and pinch strength this date, and completed restricted forearm supination test negative, and scratch collapse test negative.  moderate fascial restrictions noted in brachioradialis region of arm tender with manual intervention, however good vasomotor response elicited with MFR intervention.  Initiated iontophoresis this date for pain reduction in rlight superior elbow region.    Body Structure / Function / Physical Skills  ADL;Fascial restriction;Strength;Pain;UE functional use    Plan  P:  Follow up on HEP adherance, pain relief with iontophoresis, test scapular stabalizers.       Patient will benefit from skilled therapeutic intervention in order to improve the following deficits and impairments:   Body Structure / Function / Physical Skills: ADL, Fascial restriction, Strength, Pain, UE functional use       Visit Diagnosis: 1. Pain in right elbow   2. Other symptoms and signs involving the musculoskeletal system       Problem List Patient Active Problem List   Diagnosis Date Noted  . MDD (major depressive disorder), recurrent severe, without psychosis ([place]) 11/02/2015  . SYNCOPE 02/15/2010  . POLYCYSTIC OVARIAN DISEASE 02/14/2010  . GRAND MAL SEIZURE 02/14/2010  . [facility] 02/14/2010  . PREMATURE VENTRICULAR CONTRACTIONS 02/14/2010  . OTHER PSORIASIS AND SIMILAR DISORDERS 02/14/2010  . DEAFNESS, CONGENITAL 02/14/2010  . HEART MURMUR, BENIGN 02/14/2010    Vangie Bicker, [place], OTR/L [phone]  02/03/2019, 4:50 PM  [place], [place], [address] Phone: [phone]   Fax:  [phone]  Name: Pam Graham MRN: [id number] Date of Birth: 1971-01-10

## 2019-02-04 ENCOUNTER — Ambulatory Visit: Payer: BLUE CROSS/BLUE SHIELD | Admitting: Psychiatry

## 2019-02-06 ENCOUNTER — Encounter (HOSPITAL_COMMUNITY): Payer: Self-pay | Admitting: Specialist

## 2019-02-06 ENCOUNTER — Ambulatory Visit (HOSPITAL_COMMUNITY): Payer: BC Managed Care – PPO | Admitting: Specialist

## 2019-02-06 ENCOUNTER — Other Ambulatory Visit: Payer: Self-pay

## 2019-02-06 DIAGNOSIS — R29898 Other symptoms and signs involving the musculoskeletal system: Secondary | ICD-10-CM

## 2019-02-06 DIAGNOSIS — M25521 Pain in right elbow: Secondary | ICD-10-CM

## 2019-02-06 NOTE — Therapy (Signed)
[place], [place], [address] Phone: [phone]   Fax:  [phone]  Occupational Therapy Treatment  Patient Details  Name: Pam Graham MRN: [id number] Date of Birth: 04/04/1971 Referring Provider (OT): Dorna Leitz, MD   Encounter Date: 02/06/2019  OT End of Session - 02/06/19 1601    Visit Number  3    Number of Visits  8    Date for OT Re-Evaluation  02/27/19    Authorization Type  [employer] P[employer] $40 copay    Authorization Time Period  60 visit limit for PT/SLP/OT 1 used    Authorization - Visit Number  4    Authorization - Number of Visits  57    OT Start Time  1515    OT Stop Time  1602    OT Time Calculation (min)  47 min    Activity Tolerance  Patient tolerated treatment well    Behavior During Therapy  [facility] for tasks assessed/performed       Past Medical History:  Diagnosis Date  . Anal fissure   . Anxiety   . Deafness in right ear    congenital  . Depression   . Erythrodermic psoriasis   . Fatty liver 12/26/11  . Hypertension   . IBS (irritable bowel syndrome)   . Ovarian cyst   . PAC (premature atrial contraction) 04/02/08  . PCOS (polycystic ovarian syndrome)   . PVC (premature ventricular contraction) 04/02/08  . Seizure disorder ([place])   . Tubular adenoma of colon     Past Surgical History:  Procedure Laterality Date  . ABDOMINOPLASTY    . COLONOSCOPY    . CYSTOSCOPY    . LIPOSUCTION      There were no vitals filed for this visit.  Subjective Assessment - 02/06/19 1601    Subjective   S:  what we did last time helped, its not all the way better but there is definitely improvement.    Currently in Pain?  Yes    Pain Score  3     Pain Location  Elbow    Pain Orientation  Right;Lateral    Pain Descriptors / Indicators  Aching         [facility] OT Assessment - 02/06/19 0001      Assessment   Medical Diagnosis  right tennis elbow    Referring Provider (OT)  Dorna Leitz,  MD               OT Treatments/Exercises (OP) - 02/06/19 0001      Exercises   Exercises  Elbow      Elbow Exercises   Elbow Extension  Strengthening;15 reps    Bar Weights/Barbell (Elbow Extension)  2 lbs    Forearm Supination  Strengthening;15 reps    Bar Weights/Barbell (Forearm Supination)  2 lbs    Other elbow exercises  wrist extension with 2# 15 times     Other elbow exercises  shoulder extension with 2# 10 times       Additional Elbow Exercises   Wall Pushups/Modified Pushups  ball on wall with tricep engaged shoulder at 90 for 1 minute       Manual Therapy   Manual Therapy  Myofascial release    Manual therapy comments  manual therapy intervention completed seperately from all other intervention this date.      Myofascial Release  myofascial release to right extensor forearm, lateral epicondyle region, and upper arm, superior to  elbow along brachioradialis.                 OT Short Term Goals - 02/03/19 1646      OT SHORT TERM GOAL #1   Title  Patient will be educated and independent with her HEP in order to faciliate her progress in therapy and allow her to complete her work activities with less discomfort and pain in her right elbow.    Time  4    Period  Weeks    Status  On-going    Target Date  02/27/19      OT SHORT TERM GOAL #2   Title  Patient will report a decrease in elbow pain of approximately 3/10 or less with increased use and while completing her computer work for work.    Time  4    Period  Weeks    Status  On-going      OT SHORT TERM GOAL #3   Title  Patient will demonstrate decreased fascial restrictions of trace amount in her right elbow in order to decrease her pain level while gripping and holding objects for an extended amount of time.    Time  4    Period  Weeks    Status  On-going               Plan - 02/06/19 1602    Clinical Impression Statement  A:  decreased fascial restrictions noted this date in upper arm  superior to elbow.  minimal restrictions noted in extensor forearm.  good results noted with ionto at last session, therefore continued this session.  patient felt tightness in upper arm during tricep kickbacks.    Body Structure / Function / Physical Skills  ADL;Fascial restriction;Strength;Pain;UE functional use    Plan  P:  continue iontophoresis for an additional 6 sessions, add eccentric tricep extension and shoulder extension with theraband.       Patient will benefit from skilled therapeutic intervention in order to improve the following deficits and impairments:   Body Structure / Function / Physical Skills: ADL, Fascial restriction, Strength, Pain, UE functional use       Visit Diagnosis: 1. Pain in right elbow   2. Other symptoms and signs involving the musculoskeletal system       Problem List Patient Active Problem List   Diagnosis Date Noted  . MDD (major depressive disorder), recurrent severe, without psychosis ([place]) 11/02/2015  . SYNCOPE 02/15/2010  . POLYCYSTIC OVARIAN DISEASE 02/14/2010  . GRAND MAL SEIZURE 02/14/2010  . [facility] 02/14/2010  . PREMATURE VENTRICULAR CONTRACTIONS 02/14/2010  . OTHER PSORIASIS AND SIMILAR DISORDERS 02/14/2010  . DEAFNESS, CONGENITAL 02/14/2010  . HEART MURMUR, BENIGN 02/14/2010    Vangie Bicker, [place], OTR/L [phone]  02/06/2019, 4:08 PM  [place], [place], [address] Phone: [phone]   Fax:  [phone]  Name: Pam Graham MRN: [id number] Date of Birth: 1970/12/12

## 2019-02-10 ENCOUNTER — Other Ambulatory Visit: Payer: Self-pay

## 2019-02-10 ENCOUNTER — Ambulatory Visit (HOSPITAL_COMMUNITY): Payer: BC Managed Care – PPO

## 2019-02-10 ENCOUNTER — Encounter (HOSPITAL_COMMUNITY): Payer: Self-pay

## 2019-02-10 DIAGNOSIS — M25521 Pain in right elbow: Secondary | ICD-10-CM

## 2019-02-10 DIAGNOSIS — R29898 Other symptoms and signs involving the musculoskeletal system: Secondary | ICD-10-CM | POA: Diagnosis not present

## 2019-02-11 ENCOUNTER — Ambulatory Visit (INDEPENDENT_AMBULATORY_CARE_PROVIDER_SITE_OTHER): Payer: BC Managed Care – PPO | Admitting: Psychiatry

## 2019-02-11 DIAGNOSIS — Z87898 Personal history of other specified conditions: Secondary | ICD-10-CM

## 2019-02-11 DIAGNOSIS — Z630 Problems in relationship with spouse or partner: Secondary | ICD-10-CM

## 2019-02-11 DIAGNOSIS — F411 Generalized anxiety disorder: Secondary | ICD-10-CM | POA: Diagnosis not present

## 2019-02-11 DIAGNOSIS — F3341 Major depressive disorder, recurrent, in partial remission: Secondary | ICD-10-CM | POA: Diagnosis not present

## 2019-02-11 NOTE — Therapy (Addendum)
[place], [place], [address] Phone: [phone]   Fax:  [phone]  Occupational Therapy Treatment  Patient Details  Name: Pam Graham MRN: [id number] Date of Birth: [birth year] Referring Provider (OT): Dorna Leitz, MD   Encounter Date: 02/10/2019  OT End of Session - 02/10/19 1817    Visit Number  4    Number of Visits  8    Date for OT Re-Evaluation  02/27/19    Authorization Type  [employer] PPO $40 copay    Authorization Time Period  60 visit limit for PT/SLP/OT 1 used    Authorization - Visit Number  5    Authorization - Number of Visits  12    OT Start Time  6962    OT Stop Time  1825    OT Time Calculation (min)  55 min    Activity Tolerance  Patient tolerated treatment well    Behavior During Therapy  [facility] for tasks assessed/performed       Past Medical History:  Diagnosis Date  . Anal fissure   . Anxiety   . Deafness in right ear    congenital  . Depression   . Erythrodermic psoriasis   . Fatty liver 12/26/11  . Hypertension   . IBS (irritable bowel syndrome)   . Ovarian cyst   . PAC (premature atrial contraction) 04/02/08  . PCOS (polycystic ovarian syndrome)   . PVC (premature ventricular contraction) 04/02/08  . Seizure disorder ([place])   . Tubular adenoma of colon     Past Surgical History:  Procedure Laterality Date  . ABDOMINOPLASTY    . COLONOSCOPY    . CYSTOSCOPY    . LIPOSUCTION      There were no vitals filed for this visit.  Subjective Assessment - 02/10/19 1752    Subjective   S: It feels good for a few days and then it starts back to hurting again.    Currently in Pain?  Yes    Pain Score  2     Pain Location  Elbow    Pain Orientation  Right;Lateral    Pain Descriptors / Indicators  Aching    Pain Type  Chronic pain    Pain Radiating Towards  occassionally will radiate down the forearm    Pain Onset  More than a month ago    Pain Frequency  Intermittent     Aggravating Factors   increased use and gripping tasks, typing, gripping glass    Pain Relieving Factors  iontophoresis    Effect of Pain on Daily Activities  min effect         [facility] OT Assessment - 02/10/19 0927      Assessment   Medical Diagnosis  right tennis elbow      Precautions   Precautions  None               OT Treatments/Exercises (OP) - 02/10/19 1814      Exercises   Exercises  Elbow      Elbow Exercises   Elbow Extension  Strengthening;15 reps    Bar Weights/Barbell (Elbow Extension)  2 lbs    Forearm Supination  Strengthening;15 reps    Bar Weights/Barbell (Forearm Supination)  2 lbs    Other elbow exercises  eccentric wrist extension with 2# 15 times     Other elbow exercises  shoulder extension with 2# 10 times       Neurological Re-education Exercises  Other Exercises 1  Eccentric elbow extension with arm extended overhead; 10X red theraband      Modalities   Modalities  Iontophoresis      Iontophoresis   Type of Iontophoresis  Dexamethasone    Location  right superior elbow     Dose  2 cc 4.0 mA     Time  15 minutes      Manual Therapy   Manual Therapy  Myofascial release    Manual therapy comments  manual therapy intervention completed seperately from all other intervention this date.      Myofascial Release  myofascial release to right extensor forearm, lateral epicondyle region, and upper arm, superior to elbow along brachioradialis.                 OT Short Term Goals - 02/03/19 1646      OT SHORT TERM GOAL #1   Title  Patient will be educated and independent with her HEP in order to faciliate her progress in therapy and allow her to complete her work activities with less discomfort and pain in her right elbow.    Time  4    Period  Weeks    Status  On-going    Target Date  02/27/19      OT SHORT TERM GOAL #2   Title  Patient will report a decrease in elbow pain of approximately 3/10 or less with increased use and  while completing her computer work for work.    Time  4    Period  Weeks    Status  On-going      OT SHORT TERM GOAL #3   Title  Patient will demonstrate decreased fascial restrictions of trace amount in her right elbow in order to decrease her pain level while gripping and holding objects for an extended amount of time.    Time  4    Period  Weeks    Status  On-going               Plan - 02/10/19 1817    Clinical Impression Statement  A: Pt reports she has a follow up appointment with Dr. Berenice Primas next week. She reports that she does have pain relief for a few days although it does return. She is unable to pinpoint what activity she completes were she notices pain. Added eccentric elbow extension with theraband this session. Continued with manual techniques for fascial restrictions.    Body Structure / Function / Physical Skills  ADL;Fascial restriction;Strength;Pain;UE functional use    Plan  P: Follow up on MD appointment. Continue with iontophoresis. Continue with eccentric exercises.    Consulted and Agree with Plan of Care  Patient       Patient will benefit from skilled therapeutic intervention in order to improve the following deficits and impairments:   Body Structure / Function / Physical Skills: ADL, Fascial restriction, Strength, Pain, UE functional use       Visit Diagnosis: 1. Pain in right elbow   2. Other symptoms and signs involving the musculoskeletal system       Problem List Patient Active Problem List   Diagnosis Date Noted  . MDD (major depressive disorder), recurrent severe, without psychosis ([place]) 11/02/2015  . SYNCOPE 02/15/2010  . POLYCYSTIC OVARIAN DISEASE 02/14/2010  . GRAND MAL SEIZURE 02/14/2010  . [facility] 02/14/2010  . PREMATURE VENTRICULAR CONTRACTIONS 02/14/2010  . OTHER PSORIASIS AND SIMILAR DISORDERS 02/14/2010  . DEAFNESS, CONGENITAL 02/14/2010  . HEART MURMUR,  BENIGN 02/14/2010   Ailene Ravel, OTR/L,CBIS   [phone]  02/11/2019, 9:27 AM  [place] [address] [place], [place], [address] Phone: [phone]   Fax:  [phone]  Name: Pam Graham MRN: [id number] Date of Birth: 1970-10-17

## 2019-02-11 NOTE — Progress Notes (Signed)
Psychotherapy Progress Note [facility] [name], PhD LP  Patient ID: Pam Graham     MRN: [id number]     Therapy format: Individual psychotherapy Date: 02/11/2019     Start: 6:19p Stop: 7:10p Time Spent: 51 min Location: in-person   Session narrative (presenting needs, interim history, self-report of stressors and symptoms, applications of prior therapy, status changes, and interventions made in session) Got news her job is being phased out.  Has until Oct. 19 to find other work within or outside [employer], plus 4 wks severance.  Thankfully, saved up while living with [name], but it means end of a 3.5-yr job.  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  Support/empathy provided for having to take on one more adjustment pressure.  Mike's Asperger's son [name] still at his house, still creating a stalemate about visiting [name] at home.  Tales told of [name] presuming privileges.  PT took advice to ask [name] not "when" but "how" they can move into being able to come over, but all [name] has to say is he doesn't know, he wants [name] to "realize" he should let up about his grudge with PT, and "don't bug me about it".  Seems stuck in conflict avoidance.   Brainstormed ways of getting an accepting conversation to work out more how to proceed, and possibly to offer the possibility of professional consultation with someone who knows ASD.  Therapeutic modalities: Cognitive Behavioral Therapy, Assertiveness/Communication and Solution-Oriented/Positive Psychology  Mental Status/Observations:  Appearance:   Casual     Behavior:  Appropriate  Motor:  Normal  Speech/Language:   Clear and Coherent  Affect:  Appropriate  Mood:  euthymic except re job issue  Thought process:  normal  Thought content:    WNL  Sensory/Perceptual disturbances:    WNL  Orientation:  grossly  intact  Attention:  Good  Concentration:  Good  Memory:  WNL  Insight:    Good  Judgment:   Good  Impulse Control:  Good   Risk Assessment: Danger to Self: No Self-injurious Behavior: No Danger to Others: No Physical Aggression / Violence: No Duty to Warn: No Access to Firearms a concern: No  Assessment of progress:  progressing  Diagnosis:   ICD-10-CM   1. Generalized anxiety disorder  F41.1   2. Recurrent major depressive disorder, in partial remission ([place])  F33.41   3. Relationship problem between partners  Z63.0   4. History of seizures  Z87.898    Plan:  Marland Kitchen Apply communication tips . Option to offer to [place] resources for his son and/or himself working with him . Other recommendations/advice as noted above . Continue to utilize previously learned skills ad lib . Maintain medication as prescribed and work faithfully with relevant prescriber(s) if any changes are desired or seem indicated . Call the clinic on-call service, present to ER, or call 911 if any life-threatening psychiatric crisis Return in about 3 weeks (around 03/04/2019).  Blanchie Serve, PhD [name], PhD LP Clinical Psychologist, [facility] [address], [place], [place] [address] [phone]

## 2019-02-12 ENCOUNTER — Ambulatory Visit (HOSPITAL_COMMUNITY): Payer: BC Managed Care – PPO

## 2019-02-12 DIAGNOSIS — M7711 Lateral epicondylitis, right elbow: Secondary | ICD-10-CM | POA: Diagnosis not present

## 2019-02-17 ENCOUNTER — Ambulatory Visit (HOSPITAL_COMMUNITY): Payer: BC Managed Care – PPO | Admitting: Occupational Therapy

## 2019-02-19 ENCOUNTER — Encounter (HOSPITAL_COMMUNITY): Payer: BC Managed Care – PPO | Admitting: Occupational Therapy

## 2019-02-24 ENCOUNTER — Encounter (HOSPITAL_COMMUNITY): Payer: BC Managed Care – PPO | Admitting: Occupational Therapy

## 2019-02-25 ENCOUNTER — Ambulatory Visit: Payer: BC Managed Care – PPO | Admitting: Psychiatry

## 2019-02-26 ENCOUNTER — Encounter (HOSPITAL_COMMUNITY): Payer: BC Managed Care – PPO

## 2019-03-04 DIAGNOSIS — B078 Other viral warts: Secondary | ICD-10-CM | POA: Diagnosis not present

## 2019-03-04 DIAGNOSIS — Z79899 Other long term (current) drug therapy: Secondary | ICD-10-CM | POA: Diagnosis not present

## 2019-03-04 DIAGNOSIS — L821 Other seborrheic keratosis: Secondary | ICD-10-CM | POA: Diagnosis not present

## 2019-03-04 DIAGNOSIS — L409 Psoriasis, unspecified: Secondary | ICD-10-CM | POA: Diagnosis not present

## 2019-03-04 DIAGNOSIS — Z86018 Personal history of other benign neoplasm: Secondary | ICD-10-CM | POA: Diagnosis not present

## 2019-03-16 DIAGNOSIS — M7021 Olecranon bursitis, right elbow: Secondary | ICD-10-CM | POA: Diagnosis not present

## 2019-04-08 DIAGNOSIS — R87612 Low grade squamous intraepithelial lesion on cytologic smear of cervix (LGSIL): Secondary | ICD-10-CM | POA: Diagnosis not present

## 2019-04-08 DIAGNOSIS — N879 Dysplasia of cervix uteri, unspecified: Secondary | ICD-10-CM | POA: Diagnosis not present

## 2019-04-28 ENCOUNTER — Ambulatory Visit: Payer: BC Managed Care – PPO | Admitting: Physician Assistant

## 2019-04-28 DIAGNOSIS — R87612 Low grade squamous intraepithelial lesion on cytologic smear of cervix (LGSIL): Secondary | ICD-10-CM | POA: Diagnosis not present

## 2019-05-05 ENCOUNTER — Other Ambulatory Visit: Payer: Self-pay

## 2019-05-05 DIAGNOSIS — Z20822 Contact with and (suspected) exposure to covid-19: Secondary | ICD-10-CM

## 2019-05-07 LAB — NOVEL CORONAVIRUS, NAA: SARS-CoV-2, NAA: NOT DETECTED

## 2019-05-12 ENCOUNTER — Ambulatory Visit (HOSPITAL_COMMUNITY)
Admission: RE | Admit: 2019-05-12 | Discharge: 2019-05-12 | Disposition: A | Discharge status: Home / Self Care | Payer: BC Managed Care – PPO | Source: Ambulatory Visit | Attending: Physician Assistant | Admitting: Physician Assistant

## 2019-05-12 ENCOUNTER — Other Ambulatory Visit: Payer: Self-pay

## 2019-05-12 ENCOUNTER — Other Ambulatory Visit (HOSPITAL_COMMUNITY): Payer: Self-pay | Admitting: Physician Assistant

## 2019-05-12 ENCOUNTER — Ambulatory Visit (HOSPITAL_COMMUNITY): Payer: BC Managed Care – PPO

## 2019-05-12 ENCOUNTER — Encounter (HOSPITAL_COMMUNITY): Payer: Self-pay

## 2019-05-12 DIAGNOSIS — K838 Other specified diseases of biliary tract: Secondary | ICD-10-CM | POA: Diagnosis not present

## 2019-05-12 DIAGNOSIS — K8050 Calculus of bile duct without cholangitis or cholecystitis without obstruction: Secondary | ICD-10-CM | POA: Diagnosis not present

## 2019-05-12 DIAGNOSIS — R0789 Other chest pain: Secondary | ICD-10-CM

## 2019-05-12 DIAGNOSIS — R079 Chest pain, unspecified: Secondary | ICD-10-CM | POA: Diagnosis not present

## 2019-05-12 DIAGNOSIS — R0602 Shortness of breath: Secondary | ICD-10-CM | POA: Diagnosis not present

## 2019-05-12 DIAGNOSIS — Z20828 Contact with and (suspected) exposure to other viral communicable diseases: Secondary | ICD-10-CM | POA: Diagnosis not present

## 2019-05-12 DIAGNOSIS — Z0001 Encounter for general adult medical examination with abnormal findings: Secondary | ICD-10-CM | POA: Diagnosis not present

## 2019-05-12 DIAGNOSIS — Z13220 Encounter for screening for lipoid disorders: Secondary | ICD-10-CM | POA: Diagnosis not present

## 2019-05-12 DIAGNOSIS — Z0000 Encounter for general adult medical examination without abnormal findings: Secondary | ICD-10-CM | POA: Diagnosis not present

## 2019-05-12 DIAGNOSIS — Z6834 Body mass index (BMI) 34.0-34.9, adult: Secondary | ICD-10-CM | POA: Diagnosis not present

## 2019-05-12 DIAGNOSIS — E559 Vitamin D deficiency, unspecified: Secondary | ICD-10-CM | POA: Diagnosis not present

## 2019-05-12 DIAGNOSIS — Z23 Encounter for immunization: Secondary | ICD-10-CM | POA: Diagnosis not present

## 2019-05-20 ENCOUNTER — Ambulatory Visit: Payer: BC Managed Care – PPO | Admitting: Physician Assistant

## 2019-06-01 ENCOUNTER — Other Ambulatory Visit (HOSPITAL_COMMUNITY): Payer: Self-pay | Admitting: Physician Assistant

## 2019-06-01 DIAGNOSIS — R109 Unspecified abdominal pain: Secondary | ICD-10-CM

## 2019-06-01 DIAGNOSIS — K838 Other specified diseases of biliary tract: Secondary | ICD-10-CM

## 2019-06-11 ENCOUNTER — Other Ambulatory Visit (HOSPITAL_COMMUNITY): Payer: BC Managed Care – PPO

## 2019-06-11 ENCOUNTER — Ambulatory Visit (INDEPENDENT_AMBULATORY_CARE_PROVIDER_SITE_OTHER): Payer: BC Managed Care – PPO | Admitting: Physician Assistant

## 2019-06-11 ENCOUNTER — Encounter: Payer: Self-pay | Admitting: Physician Assistant

## 2019-06-11 DIAGNOSIS — F411 Generalized anxiety disorder: Secondary | ICD-10-CM | POA: Diagnosis not present

## 2019-06-11 DIAGNOSIS — F4323 Adjustment disorder with mixed anxiety and depressed mood: Secondary | ICD-10-CM

## 2019-06-11 MED ORDER — ALPRAZOLAM 0.5 MG PO TABS: 0.2500 mg | ORAL_TABLET | Freq: Three times a day (TID) | ORAL | 1 refills | Status: DC | PRN

## 2019-06-11 MED ORDER — SERTRALINE HCL 100 MG PO TABS: 100.0000 mg | ORAL_TABLET | Freq: Every day | ORAL | 0 refills | Status: DC

## 2019-06-11 NOTE — Progress Notes (Signed)
[facility]  Patient ID: Pam Graham,  MRN: [id number]  PCP: Tobin Chad, MD  Date of Evaluation: 06/11/2019 Time spent:15 minutes  Chief Complaint:  Chief Complaint    Anxiety; Depression     Virtual Visit via Telephone Note  I connected with patient by a video enabled telemedicine application or telephone, with their informed consent, and verified patient privacy and that I am speaking with the correct person using two identifiers.  I am private, in my office and the patient is home.  I discussed the limitations, risks, security and privacy concerns of performing an evaluation and management service by telephone and the availability of in person appointments. I also discussed with the patient that there may be a patient responsible charge related to this service. The patient expressed understanding and agreed to proceed.   I discussed the assessment and treatment plan with the patient. The patient was provided an opportunity to ask questions and all were answered. The patient agreed with the plan and demonstrated an understanding of the instructions.   The patient was advised to call back or seek an in-person evaluation if the symptoms worsen or if the condition fails to improve as anticipated.  I provided 15 minutes of non-face-to-face time during this encounter.  HISTORY/CURRENT STATUS: HPI for routine med check.  Patient has been under a lot more stress.  See social history.  She is wondering if we need to increase the Zoloft.  She has had a hard time enjoying things.  Energy and motivation are low.  "I know it is all the changes going on in my life."  Denies suicidal or homicidal thoughts.  She is sleeping well.  She has had anxiety thinking about all the changes she is going through.  No panic attacks but more an underlying sense of being overwhelmed.  Patient denies increased energy with decreased need for sleep, no increased talkativeness, no racing  thoughts, no impulsivity or risky behaviors, no increased spending, no increased libido, no grandiosity.  Denies dizziness, syncope, seizures, numbness, tingling, tremor, tics, unsteady gait, slurred speech, confusion. Denies muscle or joint pain, stiffness, or dystonia.  Individual Medical History/ Review of Systems: Changes? :No    Past medications for mental health diagnoses include: Prozac, trazodone, Xanax  Allergies: Iohexol, Penicillins, and Clindamycin  Current Medications:  Current Outpatient Medications:  .  cetirizine (ZYRTEC) 10 MG tablet, Take 10 mg by mouth daily., Disp: , Rfl:  .  cholecalciferol (VITAMIN D3) 25 MCG (1000 UT) tablet, Take 5,000 Units by mouth daily., Disp: , Rfl:  .  hydrOXYzine (VISTARIL) 25 MG capsule, Take 1 capsule (25 mg total) by mouth daily as needed., Disp: 30 capsule, Rfl: 0 .  metFORMIN (GLUCOPHAGE) 500 MG tablet, TK 2 TS PO D, Disp: , Rfl:  .  norethindrone-ethinyl estradiol-iron (MICROGESTIN FE,GILDESS FE,LOESTRIN FE) 1.5-30 MG-MCG tablet, Take 1 tablet by mouth at bedtime., Disp: , Rfl:  .  ALPRAZolam (XANAX) 0.5 MG tablet, Take 0.5-1 tablets (0.25-0.5 mg total) by mouth 3 (three) times daily as needed for anxiety., Disp: 30 tablet, Rfl: 1 .  glycopyrrolate (ROBINUL-FORTE) 2 MG tablet, Take 1 tablet (2 mg total) by mouth 2 (two) times daily. (Patient not taking: Reported on 01/26/2019), Disp: 60 tablet, Rfl: 11 .  levETIRAcetam (KEPPRA XR) 500 MG 24 hr tablet, Take 2 tablets (1,000 mg total) by mouth at bedtime. (Patient not taking: Reported on 08/06/2018), Disp: 60 tablet, Rfl: 0 .  sertraline (ZOLOFT) 100 MG tablet, Take 1 tablet (  100 mg total) by mouth daily., Disp: 90 tablet, Rfl: 0 Medication Side Effects: none  Family Medical/ Social History: Changes? Yes new position at her company, but doesn't like it and is working out her notice.  Tomorrow is her last day there.  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  MENTAL  HEALTH EXAM:  There were no vitals taken for this visit.There is no height or weight on file to calculate BMI.  General Appearance: Unable to assess  Eye Contact:  Unable to assess  Speech:  Clear and Coherent  Volume:  Normal  Mood:  Depressed  Affect:  Depressed and Tearful  Thought Process:  Goal Directed and Descriptions of Associations: Intact  Orientation:  Full (Time, Place, and Person)  Thought Content: Logical   Suicidal Thoughts:  No  Homicidal Thoughts:  No  Memory:  WNL  Judgement:  Good  Insight:  Good  Psychomotor Activity:  Unable to assess  Concentration:  Concentration: Good  Recall:  Good  Fund of Knowledge: Good  Language: Good  Assets:  Desire for Improvement  ADL's:  Intact  Cognition: WNL  Prognosis:  Good    DIAGNOSES:    ICD-10-CM   1. Adjustment disorder with mixed anxiety and depressed mood  F43.23   2. Generalized anxiety disorder  F41.1     Receiving Psychotherapy: Yes With Dr. Jonni Sanger Mitchum   RECOMMENDATIONS:  Recommend increasing the Zoloft.  She is in agreement. Increase Zoloft to 100 mg p.o. daily. Start Xanax 0.5 mg, 1/2-1 p.o. 3 times daily as needed. She will try to get in with Dr. Rica Mote sooner than her scheduled appointment in 3 weeks. Return in 4 weeks.  Donnal Moat, PA-C

## 2019-06-17 ENCOUNTER — Other Ambulatory Visit: Payer: Self-pay

## 2019-06-17 ENCOUNTER — Encounter (HOSPITAL_COMMUNITY): Payer: Self-pay

## 2019-06-17 ENCOUNTER — Ambulatory Visit (HOSPITAL_COMMUNITY)
Admission: RE | Admit: 2019-06-17 | Discharge: 2019-06-17 | Disposition: A | Discharge status: Home / Self Care | Payer: BC Managed Care – PPO | Source: Ambulatory Visit | Attending: Physician Assistant | Admitting: Physician Assistant

## 2019-06-17 DIAGNOSIS — R109 Unspecified abdominal pain: Secondary | ICD-10-CM | POA: Insufficient documentation

## 2019-06-17 DIAGNOSIS — K838 Other specified diseases of biliary tract: Secondary | ICD-10-CM | POA: Diagnosis not present

## 2019-06-17 DIAGNOSIS — R110 Nausea: Secondary | ICD-10-CM | POA: Diagnosis not present

## 2019-06-17 MED ORDER — TECHNETIUM TC 99M MEBROFENIN IV KIT
5.0000 | PACK | Freq: Once | INTRAVENOUS | Status: AC | PRN
  Administered 2019-06-17: 5.2 via INTRAVENOUS

## 2019-06-23 ENCOUNTER — Encounter (HOSPITAL_COMMUNITY): Payer: Self-pay

## 2019-06-23 NOTE — Therapy (Signed)
[place], [place], [address] Phone: [phone]   Fax:  [phone]  Patient Details  Name: Pam Graham MRN: [id number] Date of Birth: 1971-02-02 Referring Provider:  No ref. provider found  Encounter Date: 06/23/2019   OCCUPATIONAL THERAPY DISCHARGE SUMMARY  Visits from Start of Care: 4  Current functional level related to goals / functional outcomes: Title  Patient will be educated and independent with her HEP in order to faciliate her progress in therapy and allow her to complete her work activities with less discomfort and pain in her right elbow.     Time  4    Period  Weeks    Status  On-going    Target Date  02/27/19        OT SHORT TERM GOAL #2   Title  Patient will report a decrease in elbow pain of approximately 3/10 or less with increased use and while completing her computer work for work.    Time  4    Period  Weeks    Status  On-going        OT SHORT TERM GOAL #3   Title  Patient will demonstrate decreased fascial restrictions of trace amount in her right elbow in order to decrease her pain level while gripping and holding objects for an extended amount of time.    Time  4    Period  Weeks    Status  On-going       Remaining deficits: Pt had a reassessment on 02/10/19 with a MD follow up appointment in the next few days with Dr. Berenice Primas. Patient did not return to clinic or call with update after MD appointment.  Deficits remaining at time of reassessment: fascial restrictions and pain in right elbow.   Education / Equipment: Elbow HEP Plan: Patient agrees to discharge.  Patient goals were not met. Patient is being discharged due to not returning since the last visit.  ?????         Ailene Ravel, OTR/L,CBIS  [phone]  06/23/2019, 9:30 AM  [place] [address] [place], [place], [address] Phone:  [phone]   Fax:  [phone]

## 2019-06-24 ENCOUNTER — Encounter

## 2019-06-29 ENCOUNTER — Telehealth: Payer: Self-pay

## 2019-06-30 ENCOUNTER — Other Ambulatory Visit: Payer: Self-pay

## 2019-06-30 ENCOUNTER — Ambulatory Visit (INDEPENDENT_AMBULATORY_CARE_PROVIDER_SITE_OTHER): Payer: Self-pay | Admitting: Psychiatry

## 2019-06-30 DIAGNOSIS — F411 Generalized anxiety disorder: Secondary | ICD-10-CM

## 2019-06-30 DIAGNOSIS — F4323 Adjustment disorder with mixed anxiety and depressed mood: Secondary | ICD-10-CM

## 2019-06-30 DIAGNOSIS — Z630 Problems in relationship with spouse or partner: Secondary | ICD-10-CM

## 2019-06-30 NOTE — Progress Notes (Signed)
Psychotherapy Progress Note [facility] 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  Worry about being rusty, worry that she may find one more job she doesn't like, just change fatigue at this point.  Knows she will be OK, but really doesn't want to have to wonder and do the uncertainty of it all.  Has increased Zoloft and been given some Xanax recently.  Meanwhile, BF Pam Graham (c. 47yo) needs a knee replacement, still dealing with Parkinson's, the couple remain in their own homes, and he is in constant pain but delaying surgery for trying to get vaccinated, taking Advil frequently (hx of opioid abuse), doesn't sleep well, plus they really can't make any plans together and she delayed her work start 2 weeks thinking she would be taking care of him.  Finds herself having her insecurities pop up again when she sees no replies to texts, no read receipts.    Less autonomic arousal with better med help, but too open to worries and insecurities with so much unstructured time.  Focused on "Who am I when you're not here?" as a likely provocation.  Sent Pam Graham some flowers New Year's that apparently got trashed by his son (Asperger's, hostile to PT), and Pam Graham took it personally that she was checking up on her  gift, had an outbreak of him getting irritable and making irrational claims of her being controlling.  Able to ask rather than react, which helped him de-escalate and walk back, led to an apology.  The harder part is seeing that mood come back.  Interpreted his defensive communication, highlighted how she checked perceptions instead of reacting or going frankly needy, and offered charitable interpretation that Pam Graham was temporarily affected by pain, Parkinson's the uncertainty of COVID risk and an anniversary reaction to the problems of a year ago, simultaneously, and that he sounded genuinely remorseful by her account, just not able to state more clearly and sympathetically his need to suppress uncertainty and grief or his need to convey how these things make him feel, resorting more instinctively to inflict the experience.  Suggested she could, if needed in the future, ask if the harshness describes how he feels, dealing with what he is dealing with, rather than the truth of his opinion of her.  Affirmed PT's readiness to take on her new job and the value in engaging time where she is not tempted to wonder or worry what to do next or wait tense for her relationship to flourish.  Therapeutic modalities: Cognitive Behavioral Therapy and Solution-Oriented/Positive Psychology  Mental Status/Observations:  Appearance:   Casual     Behavior:  Appropriate  Motor:  Normal  Speech/Language:   Clear and Coherent  Affect:  Appropriate  Mood:  anxious  Thought process:  normal  Thought  content:    WNL  Sensory/Perceptual disturbances:    WNL  Orientation:  Fully oriented  Attention:  Good  Concentration:  Good  Memory:  WNL  Insight:    Good  Judgment:   Good  Impulse Control:  Good   Risk Assessment: Danger to Self: No Self-injurious Behavior: No Danger to Others: No Physical Aggression / Violence: No Duty to Warn: No Access to Firearms a concern: No  Assessment of progress:   stabilized  Diagnosis:   ICD-10-CM   1. Generalized anxiety disorder  F41.1   2. Adjustment disorder with mixed anxiety and depressed mood  F43.23   3. Relationship problem between partners  Z63.0    Plan:  Marland Kitchen Apply insight and communication advice re. Pam Graham as indicated . Self-affirm confidence in new work . Other recommendations/advice as noted above . Continue to utilize previously learned skills ad lib . Maintain medication as prescribed and work faithfully with relevant prescriber(s) if any changes are desired or seem indicated . Call the clinic on-call service, present to ER, or call 911 if any life-threatening psychiatric crisis Return for time as available. Current [facility] appointments: Future Appointments  Date Time Provider [place]  07/27/2019 11:30 AM Thompson Grayer, MD [facility]  08/03/2019  4:00 PM Addison Lank, PA-C [facility] None  08/05/2019  6:00 PM Blanchie Serve, PhD [facility] None    Blanchie Serve, PhD [name], PhD LP Clinical Psychologist, [facility] [address], [place], [place] [address] [phone]

## 2019-07-01 ENCOUNTER — Telehealth: Payer: BC Managed Care – PPO | Admitting: Internal Medicine

## 2019-07-23 ENCOUNTER — Ambulatory Visit: Payer: BC Managed Care – PPO | Admitting: Physician Assistant

## 2019-07-27 ENCOUNTER — Telehealth: Payer: Self-pay | Admitting: Internal Medicine

## 2019-08-03 ENCOUNTER — Ambulatory Visit: Payer: Self-pay | Admitting: Physician Assistant

## 2019-08-05 ENCOUNTER — Ambulatory Visit: Payer: Self-pay | Admitting: Psychiatry

## 2019-08-23 ENCOUNTER — Other Ambulatory Visit: Payer: Self-pay | Admitting: Physician Assistant

## 2019-08-29 ENCOUNTER — Ambulatory Visit: Payer: Self-pay | Attending: Internal Medicine

## 2019-08-29 DIAGNOSIS — Z23 Encounter for immunization: Secondary | ICD-10-CM | POA: Insufficient documentation

## 2019-08-29 NOTE — Progress Notes (Signed)
   Covid-19 Vaccination Clinic  Name:  Pam Graham    MRN: [id number] DOB: 10-09-1970  08/29/2019  [name] was observed post Covid-19 immunization for 15 minutes without incident. She was provided with Vaccine Information Sheet and instruction to access the V-Safe system.   [name] was instructed to call 911 with any severe reactions post vaccine: Marland Kitchen Difficulty breathing  . Swelling of face and throat  . A fast heartbeat  . A bad rash all over body  . Dizziness and weakness   Immunizations Administered    Name Date Dose VIS Date Route   Moderna COVID-19 Vaccine 08/29/2019  9:42 AM 0.5 mL 05/26/2019 Intramuscular   Manufacturer: Moderna   Lot: [id number]   [place]PO:[id number]

## 2019-09-30 ENCOUNTER — Ambulatory Visit: Payer: Self-pay | Attending: Internal Medicine

## 2019-09-30 DIAGNOSIS — Z23 Encounter for immunization: Secondary | ICD-10-CM

## 2019-09-30 NOTE — Progress Notes (Signed)
   Covid-19 Vaccination Clinic  Name:  Pam Graham    MRN: [id number] DOB: 12-04-1970  09/30/2019  [name] was observed post Covid-19 immunization for 15 minutes without incident. She was provided with Vaccine Information Sheet and instruction to access the V-Safe system.   [name] was instructed to call 911 with any severe reactions post vaccine: Marland Kitchen Difficulty breathing  . Swelling of face and throat  . A fast heartbeat  . A bad rash all over body  . Dizziness and weakness   Immunizations Administered    Name Date Dose VIS Date Route   Moderna COVID-19 Vaccine 09/30/2019  3:26 PM 0.5 mL 05/26/2019 Intramuscular   Manufacturer: Levan Hurst   Lot: [id number]   [place]: [id number]      Covid-19 Vaccination Clinic  Name:  Pam Graham    MRN: [id number] DOB: July 18, 1970  09/30/2019  [name] was observed post Covid-19 immunization for 15 minutes without incident. She was provided with Vaccine Information Sheet and instruction to access the V-Safe system.   [name] was instructed to call 911 with any severe reactions post vaccine: Marland Kitchen Difficulty breathing  . Swelling of face and throat  . A fast heartbeat  . A bad rash all over body  . Dizziness and weakness   Immunizations Administered    Name Date Dose VIS Date Route   Moderna COVID-19 Vaccine 09/30/2019  3:26 PM 0.5 mL 05/26/2019 Intramuscular   Manufacturer: Levan Hurst   LotFY:[id number]   [place]:[id number]

## 2019-10-15 ENCOUNTER — Ambulatory Visit (INDEPENDENT_AMBULATORY_CARE_PROVIDER_SITE_OTHER): Payer: 59 | Admitting: Psychiatry

## 2019-10-15 ENCOUNTER — Other Ambulatory Visit: Payer: Self-pay

## 2019-10-15 DIAGNOSIS — F411 Generalized anxiety disorder: Secondary | ICD-10-CM | POA: Diagnosis not present

## 2019-10-15 DIAGNOSIS — F4323 Adjustment disorder with mixed anxiety and depressed mood: Secondary | ICD-10-CM | POA: Diagnosis not present

## 2019-10-15 DIAGNOSIS — R69 Illness, unspecified: Secondary | ICD-10-CM | POA: Diagnosis not present

## 2019-10-15 NOTE — Progress Notes (Signed)
Psychotherapy Progress Note [facility] Pam Moore, PhD LP  Patient ID: Pam Graham     MRN: [id number] Therapy format: Individual psychotherapy Date: 10/15/2019      Start: 4:15p     Stop: 5:05p     Time Spent: 50 min Location: In-person   Session narrative (presenting needs, interim history, self-report of stressors and symptoms, applications of prior therapy, status changes, and interventions made in session) Last year's job was decommissioned, and the [employer] job (pre-op, post-anesthesia unit work) turned out very unwanted.  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  Enjoying the independence and lack of micromanaging.  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  Well-positioned next door to an EAP counselor, seeing benefit of EAP for employees during [place].    Still with Pam Graham, who still has Parkinson's, had a TKR, developed postoperative complications (large hematoma, which affected his walking and delayed his rehab; suspects Pam Graham's high-dose ibuprofen use set it up).  Pt is trying to learn how to be with someone with Parkinson's -- being flexible, dealing with insomnia, reduced mobility,  Hopeful of him pursuing a sleep study, may benefit from CPAP.  Discussed what to expect with sleep study, CPAP adjustment, and learning curve, and benefit.  Affirmed and encouraged.in dealing with anticipatory grief and the empathic hardship of loving an older man who has a progressive illness.  Confirmed that they have worked through  prior issues to some satisfaction and that the whole story of his sexting, defensiveness, and temporarily trying to return to a marriage he did not want still fit the overall picture of a man who was angry and bargaining with terminal illness and loss of career and identity, point being that he seems to have progressed in his own adjustment, with some credit to PT for being both real and compassionate during his unfair anger phase.  Briefly discussed sleep issues, both for her and Pam Graham, who still live separately, noting likely effect of evening blue light.  Educated on blue light control and its benefits for restorative sleep and mood.   Therapeutic modalities: Cognitive Behavioral Therapy, Solution-Oriented/Positive Psychology and Psycho-education/Bibliotherapy  Mental Status/Observations:  Appearance:   Casual, Neat and Well Groomed     Behavior:  Appropriate  Motor:  Normal  Speech/Language:   Clear and Coherent  Affect:  Appropriate, Full Range and tearful as appropriate  Mood:  normal and sad with subject  Thought process:  normal  Thought content:    WNL  Sensory/Perceptual disturbances:    WNL  Orientation:  Fully oriented  Attention:  Good    Concentration:  Good  Memory:  WNL  Insight:    Good  Judgment:   Good  Impulse Control:  Good   Risk Assessment: Danger to Self: No Self-injurious Behavior: No Danger to Others: No Physical Aggression / Violence: No Duty to Warn: No Access to Firearms a concern: No  Assessment of progress:  progressing  Diagnosis: No diagnosis found. Plan:  . Self-affirm good work with Pam Graham's anger phase and self-compassion for anticipatory  grieving in the relationship she has . Self-affirm good work landing the next job and committing to it . Recommend try blue light control -- interested in amber glasses in the evening, will share with Pam Graham . Other recommendations/advice as may be noted above . Continue to utilize previously learned skills ad  lib . Maintain medication as prescribed and work faithfully with relevant prescriber(s) if any changes are desired or seem indicated . Call the clinic on-call service, present to ER, or call 911 if any life-threatening psychiatric crisis Return for time at discretion. . Already scheduled visit in this office 10/23/2019.  Pam Serve, PhD Pam Moore, PhD LP Clinical Psychologist, [facility] [address] [place], [place] [address] [phone]

## 2019-10-23 ENCOUNTER — Encounter: Payer: Self-pay | Admitting: Physician Assistant

## 2019-10-23 ENCOUNTER — Other Ambulatory Visit: Payer: Self-pay

## 2019-10-23 ENCOUNTER — Ambulatory Visit (INDEPENDENT_AMBULATORY_CARE_PROVIDER_SITE_OTHER): Payer: 59 | Admitting: Physician Assistant

## 2019-10-23 DIAGNOSIS — F411 Generalized anxiety disorder: Secondary | ICD-10-CM

## 2019-10-23 DIAGNOSIS — F3341 Major depressive disorder, recurrent, in partial remission: Secondary | ICD-10-CM

## 2019-10-23 MED ORDER — SERTRALINE HCL 100 MG PO TABS: ORAL_TABLET | ORAL | 1 refills | Status: DC

## 2019-10-23 NOTE — Progress Notes (Signed)
[facility]  Patient ID: Pam Graham,  MRN: [id number]  PCP: [facility]  Date of Evaluation: 10/23/2019 Time spent:20 minutes  Chief Complaint:  Chief Complaint    Follow-up      HISTORY/CURRENT STATUS: HPI for routine med check.  Doing well.  At the last visit, we increased the Zoloft.  She is doing great.  She is able to enjoy things.  Energy and motivation are good.  Sleeps well most of the time.  Denies suicidal or homicidal thoughts.  Rarely has anxiety but she does have Xanax to take if needed.    Patient denies increased energy with decreased need for sleep, no increased talkativeness, no racing thoughts, no impulsivity or risky behaviors, no increased spending, no increased libido, no grandiosity.  Denies dizziness, syncope, seizures, numbness, tingling, tremor, tics, unsteady gait, slurred speech, confusion. Denies muscle or joint pain, stiffness, or dystonia.  Individual Medical History/ Review of Systems: Changes? :No    Past medications for mental health diagnoses include: Prozac, trazodone, Xanax  Allergies: Iohexol, Penicillins, and Clindamycin  Current Medications:  Current Outpatient Medications:  .  ALPRAZolam (XANAX) 0.5 MG tablet, Take 0.5-1 tablets (0.25-0.5 mg total) by mouth 3 (three) times daily as needed for anxiety., Disp: 30 tablet, Rfl: 1 .  cetirizine (ZYRTEC) 10 MG tablet, Take 10 mg by mouth daily., Disp: , Rfl:  .  cholecalciferol (VITAMIN D3) 25 MCG (1000 UT) tablet, Take 5,000 Units by mouth daily., Disp: , Rfl:  .  norethindrone-ethinyl estradiol-iron (MICROGESTIN FE,GILDESS FE,LOESTRIN FE) 1.5-30 MG-MCG tablet, Take 1 tablet by mouth at bedtime., Disp: , Rfl:  .  sertraline (ZOLOFT) 100 MG tablet, TAKE 1 TABLET(100 MG) BY MOUTH DAILY, Disp: 90 tablet, Rfl: 1 .  glycopyrrolate (ROBINUL-FORTE) 2 MG tablet, Take 1 tablet (2 mg total) by mouth 2 (two) times daily. (Patient not taking: Reported on 01/26/2019),  Disp: 60 tablet, Rfl: 11 .  levETIRAcetam (KEPPRA XR) 500 MG 24 hr tablet, Take 2 tablets (1,000 mg total) by mouth at bedtime. (Patient not taking: Reported on 08/06/2018), Disp: 60 tablet, Rfl: 0 .  metFORMIN (GLUCOPHAGE) 500 MG tablet, TK 2 TS PO D, Disp: , Rfl:  Medication Side Effects: none  Family Medical/ Social History: Changes? Yes new position as nurse for [employer]:  There were no vitals taken for this visit.There is no height or weight on file to calculate BMI.  General Appearance: Unable to assess  Eye Contact:  Unable to assess  Speech:  Clear and Coherent  Volume:  Normal  Mood:  Depressed  Affect:  Depressed and Tearful  Thought Process:  Goal Directed and Descriptions of Associations: Intact  Orientation:  Full (Time, Place, and Person)  Thought Content: Logical   Suicidal Thoughts:  No  Homicidal Thoughts:  No  Memory:  WNL  Judgement:  Good  Insight:  Good  Psychomotor Activity:  Unable to assess  Concentration:  Concentration: Good  Recall:  Good  Fund of Knowledge: Good  Language: Good  Assets:  Desire for Improvement  ADL's:  Intact  Cognition: WNL  Prognosis:  Good    DIAGNOSES:    ICD-10-CM   1. Recurrent major depressive disorder, in partial remission ([place])  F33.41   2. Generalized anxiety disorder  F41.1     Receiving Psychotherapy: Yes With Dr. [name]   RECOMMENDATIONS:  PDMP reviewed. I spent 20 minutes with her. Continue Zoloft 100 mg p.o. daily. Continue Xanax 0.5 mg,  1/2-1 p.o. 3 times daily as needed. Continue therapy with Dr. Rica Mote Return in 6 months. Donnal Moat, PA-C

## 2019-10-27 ENCOUNTER — Ambulatory Visit: Payer: Self-pay | Admitting: Physician Assistant

## 2019-11-26 ENCOUNTER — Ambulatory Visit (INDEPENDENT_AMBULATORY_CARE_PROVIDER_SITE_OTHER): Payer: 59 | Admitting: Psychiatry

## 2019-11-26 ENCOUNTER — Other Ambulatory Visit: Payer: Self-pay

## 2019-11-26 DIAGNOSIS — F3341 Major depressive disorder, recurrent, in partial remission: Secondary | ICD-10-CM

## 2019-11-26 DIAGNOSIS — Z630 Problems in relationship with spouse or partner: Secondary | ICD-10-CM | POA: Diagnosis not present

## 2019-11-26 DIAGNOSIS — F411 Generalized anxiety disorder: Secondary | ICD-10-CM | POA: Diagnosis not present
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
## 2019-12-03 ENCOUNTER — Ambulatory Visit: Payer: 59 | Attending: Internal Medicine

## 2019-12-03 ENCOUNTER — Other Ambulatory Visit: Payer: Self-pay

## 2019-12-03 DIAGNOSIS — Z20822 Contact with and (suspected) exposure to covid-19: Secondary | ICD-10-CM | POA: Insufficient documentation

## 2019-12-04 LAB — SARS-COV-2, NAA 2 DAY TAT

## 2019-12-04 LAB — NOVEL CORONAVIRUS, NAA: SARS-CoV-2, NAA: NOT DETECTED

## 2019-12-09 ENCOUNTER — Ambulatory Visit (INDEPENDENT_AMBULATORY_CARE_PROVIDER_SITE_OTHER): Payer: 59 | Admitting: Psychiatry

## 2019-12-09 ENCOUNTER — Other Ambulatory Visit: Payer: Self-pay

## 2019-12-09 DIAGNOSIS — F411 Generalized anxiety disorder: Secondary | ICD-10-CM

## 2019-12-09 DIAGNOSIS — Z630 Problems in relationship with spouse or partner: Secondary | ICD-10-CM

## 2019-12-09 DIAGNOSIS — F3341 Major depressive disorder, recurrent, in partial remission: Secondary | ICD-10-CM | POA: Diagnosis not present

## 2019-12-09 DIAGNOSIS — E282 Polycystic ovarian syndrome: Secondary | ICD-10-CM

## 2019-12-09 NOTE — Progress Notes (Signed)
Psychotherapy Progress Note [facility] Pam Moore, PhD LP  Patient ID: Pam Graham     MRN: [id number] Therapy format: Individual psychotherapy Date: 12/09/2019      Start: 8:18a     Stop: 9:08a     Time Spent: 50 min Location: In-person   Session narrative (presenting needs, interim history, self-report of stressors and symptoms, applications of prior therapy, status changes, and interventions made in session) Pam Graham has decided he wants to go into therapy, has apparently called here asking to work with Pam Graham and been approved w/o knowing the conflict of interest.  Discussed potential conflict of interest given longstanding individual therapy relationship, fair likelihood Pam Graham might see her therapist as threatening, and potential for triangling.  Recommended other providers for individual work.  PT met with Pam Graham recently, tearfully divulged he's been letting ex-wife and his autistic adult son live there, that's changing, and he is interested in restarting with her.  Restaurant conversation led to seeing each other further, but last night trying to reckon with his angry, depressive protest "I'm a shitty person" and moving to leave early.   Tentative, uneasy herself, because he's made so many abrupt turns by now between sincerely seeking her and pushing her away, turning cutting in his language, accusing her when he's under the gun about his own behavior, and the strongly ambivalent signals he gives about being able to relate.  Aware he carries old guilt/shame and resentments.    Addressed felt urgency to decide whether she is back in or out, assured it doesn't have to be that fast.  She will need to be able to say that she was all in before, ready to nurse him and outlive him if fate was such, for the sake of the time and love it was becoming, but at this point too much injury has been done -- it's a whole new relationship if they do, and it will take sincerely dealing  with his alienating behaviors, not just covering them with dramatic expressions of regret or anger toward self.  Personally, discovered she has been spending freely recently, largely on clothing, and putting herself in to debt.  Admits part of staying with Pam Graham while she did has been fear of loneliness, and part fear of not being able to manage alone financially.    Is currently in a multidisciplinary weight management program to help with weight gain and PCOS-powered appetite and fat storage.  Encouraged in carb control and exercise strategies.  Therapeutic modalities: Cognitive Behavioral Therapy and Solution-Oriented/Positive Psychology  Mental Status/Observations:  Appearance:   Casual     Behavior:  Appropriate  Motor:  Normal  Speech/Language:   Clear and Coherent  Affect:  Appropriate  Mood:  anxious and sad  Thought process:  normal  Thought content:    WNL  Sensory/Perceptual disturbances:    WNL  Orientation:  Fully oriented  Attention:  Good    Concentration:  Fair  Memory:  WNL  Insight:    Good  Judgment:   Fair  Impulse Control:  Fair   Risk Assessment: Danger to Self: No Self-injurious Behavior: No Danger to Others: No Physical Aggression / Violence: No Duty to Warn: No Access to Firearms a concern: No  Assessment of progress:  stabilized  Diagnosis:   ICD-10-CM   1. Recurrent major depressive disorder, in partial remission ([place])  F33.41   2. Relationship problem between partners  Z63.0   3. Generalized anxiety disorder  F41.1   4.  PCOS (polycystic ovarian syndrome)  E28.2    Plan:  . Pursue referrals for Pam Graham and for the two of them if desired . Self-affirm do not have to cement a new relationship if she is not convinced, OK to require to work out unfair treatment before consenting to anything . Rein in spending to reduce anxiety, seek non-costly ways to fele better . Other recommendations/advice as may be noted above . Continue to utilize previously  learned skills ad lib . Maintain medication as prescribed and work faithfully with relevant prescriber(s) if any changes are desired or seem indicated . Call the clinic on-call service, present to ER, or call 911 if any life-threatening psychiatric crisis Return for time as available. . Already scheduled visit in this office 12/22/2019.  Blanchie Serve, PhD Pam Moore, PhD LP Clinical Psychologist, [facility] [address], [place], [place] [address] [phone]

## 2019-12-22 ENCOUNTER — Ambulatory Visit: Payer: 59 | Admitting: Psychiatry

## 2019-12-31 ENCOUNTER — Ambulatory Visit: Payer: 59 | Admitting: Psychiatry

## 2020-01-14 ENCOUNTER — Ambulatory Visit: Payer: 59 | Admitting: Psychiatry

## 2020-01-21 ENCOUNTER — Encounter: Payer: Self-pay | Admitting: Psychiatry

## 2020-01-21 ENCOUNTER — Ambulatory Visit: Payer: 59 | Admitting: Psychiatry

## 2020-01-21 NOTE — Progress Notes (Signed)
Admin note for non-service contact  Patient ID: Pam Graham  MRN: [id number] DATE: 01/21/2020  Recently addressed series of short-notice cancellations -- PT had scheduled ahead, near-weekly through August, while dealing acutely with relationship crisis, and figuring out whether to engage couples counseling, get boyfriend into individual work with [place], or other options.  Once decided that boundaries would be best served by him having an independent provider, a series of short-notice cancellations have happened, leaving unused time on the clinical calendar each week.  As of today, PT took care of remaining dates for therapy and is only scheduled for medication management.  Given history of episodic therapy, presumption is that she is focusing on issues in living and conserving costs, but invitation made to let me know if there is a rapport issue as well.  Blanchie Serve, PhD [name], PhD LP Clinical Psychologist, [facility] [address] [place], [place] [address] [phone]

## 2020-02-04 ENCOUNTER — Ambulatory Visit: Payer: 59 | Admitting: Psychiatry

## 2020-02-11 ENCOUNTER — Ambulatory Visit: Payer: 59 | Admitting: Psychiatry

## 2020-02-18 ENCOUNTER — Ambulatory Visit: Payer: 59 | Admitting: Psychiatry

## 2020-02-23 ENCOUNTER — Other Ambulatory Visit: Payer: Self-pay

## 2020-02-23 ENCOUNTER — Ambulatory Visit (INDEPENDENT_AMBULATORY_CARE_PROVIDER_SITE_OTHER): Payer: 59 | Admitting: Psychiatry

## 2020-02-23 DIAGNOSIS — F4323 Adjustment disorder with mixed anxiety and depressed mood: Secondary | ICD-10-CM

## 2020-02-23 DIAGNOSIS — F411 Generalized anxiety disorder: Secondary | ICD-10-CM | POA: Diagnosis not present

## 2020-02-23 DIAGNOSIS — Z630 Problems in relationship with spouse or partner: Secondary | ICD-10-CM

## 2020-02-23 NOTE — Progress Notes (Signed)
Psychotherapy Progress Note [facility] [name], PhD LP  Patient ID: Pam Graham     MRN: [id number] Therapy format: Individual psychotherapy Date: 02/23/2020      Start: 9:19a     Stop: 10:10a     Time Spent: 51 min Location: In-person   Session narrative (presenting needs, interim history, self-report of stressors and symptoms, applications of prior therapy, status changes, and interventions made in session) Apologetic for miscommunication with office about series of appts scheduled being cancelled piecemeal -- thought she had been clear with staff that the whole series was supposed to be cancelled while she did couples counseling with [name].  Long story short, [name] withdrew from couples counseling.  Took a trip together to [place] for nearly 3 weeks, just back a couple nights ago.  Issues in therapy included allowing for Pt to have, and ask about, her own anxiety, but on the night before travel, she asked after the plan to have his ex-wife move back out of the house and he went defensive as if never worked on that.  While in [place], visited family members and [place] on [address].  While in the car, saw a suspicious notification on his phone, and while away caught [name] in multiple lies about being in touch with ex-wife, noticed loads of phone activity, some of which turned out to be multiple sexting conversations, and had seemingly providential opportunities to see not only frequent, friendly messages but lies to her as well.  Saw him get self-absorbed and morose on his birthday, which he mentioned is always hard for him.  Despite his sleep disorder, Parkinson's, fatigue, and depression, complained to her about her "drying up" sexually, and disparaged her for believing him in counseling about his interest in trying to modify behavior.   Clear at this point that the relationship is not going to work.  May be dawning that he is developing dementia on top of it.   Discussed at some length what to expect of long-term depression, indications of intractable shame and anger, and the likelihood that Mike's Parkinson's brings on dementia or may already be doing so.  Confirmed that she is at peace with the plan to break up with him and that she does not feel any inordinate amount of anxiety figuring out how.  Confirmed that she has had unwanted but positive practice at loving openly, appraising mates soberly, and calling off relationships when needed, and that she will be okay.  Therapeutic modalities: Cognitive Behavioral Therapy and Solution-Oriented/Positive Psychology  Mental Status/Observations:  Appearance:   Neat and Well Groomed     Behavior:  Appropriate  Motor:  Normal  Speech/Language:   Clear and Coherent  Affect:  Appropriate and very responsive  Mood:  normal and moderately saddened  Thought process:  normal  Thought content:    WNL  Sensory/Perceptual disturbances:    WNL  Orientation:  Fully oriented  Attention:  Good    Concentration:  Good  Memory:  WNL  Insight:    Good  Judgment:   Good  Impulse Control:  Good   Risk Assessment: Danger to Self: No Self-injurious Behavior: No Danger to Others: No Physical Aggression / Violence: No Duty to Warn: No Access to Firearms a concern: No  Assessment of progress:  progressing well  Diagnosis:   ICD-10-CM   1. Adjustment disorder with mixed anxiety and depressed mood  F43.23   2. Relationship problem between partners  Z63.0   3. Generalized anxiety  disorder  F41.1    Plan:  . Begin to consider how she will present things in breaking up with [name].  Offered to practice, role-play, critique if desired. . Other recommendations/advice as may be noted above . Continue to utilize previously learned skills ad lib . Maintain medication as prescribed and work faithfully with relevant prescriber(s) if any changes are desired or seem indicated . Call the clinic on-call service, present to ER,  or call 911 if any life-threatening psychiatric crisis Return for time at discretion. . Already scheduled visit in this office 04/21/2020.  Blanchie Serve, PhD [name], PhD LP Clinical Psychologist, [facility] [address] [place], [place] [address] [phone]

## 2020-03-22 ENCOUNTER — Ambulatory Visit (INDEPENDENT_AMBULATORY_CARE_PROVIDER_SITE_OTHER): Payer: 59 | Admitting: Psychiatry

## 2020-03-22 ENCOUNTER — Other Ambulatory Visit: Payer: Self-pay

## 2020-03-22 DIAGNOSIS — F3341 Major depressive disorder, recurrent, in partial remission: Secondary | ICD-10-CM

## 2020-03-22 DIAGNOSIS — F411 Generalized anxiety disorder: Secondary | ICD-10-CM

## 2020-03-22 DIAGNOSIS — F4323 Adjustment disorder with mixed anxiety and depressed mood: Secondary | ICD-10-CM

## 2020-03-22 DIAGNOSIS — Z630 Problems in relationship with spouse or partner: Secondary | ICD-10-CM
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
## 2020-03-29 ENCOUNTER — Other Ambulatory Visit: Payer: Self-pay

## 2020-03-29 ENCOUNTER — Ambulatory Visit (INDEPENDENT_AMBULATORY_CARE_PROVIDER_SITE_OTHER): Payer: 59 | Admitting: Psychiatry

## 2020-03-29 DIAGNOSIS — Z630 Problems in relationship with spouse or partner: Secondary | ICD-10-CM

## 2020-03-29 DIAGNOSIS — F4323 Adjustment disorder with mixed anxiety and depressed mood: Secondary | ICD-10-CM

## 2020-03-29 DIAGNOSIS — F411 Generalized anxiety disorder: Secondary | ICD-10-CM

## 2020-03-29 DIAGNOSIS — F3342 Major depressive disorder, recurrent, in full remission: Secondary | ICD-10-CM | POA: Diagnosis not present

## 2020-03-29 NOTE — Progress Notes (Signed)
Psychotherapy Progress Note [facility] [name], PhD LP  Patient ID: Pam Graham     MRN: [id number] Therapy format: Individual psychotherapy Date: 03/29/2020      Start: 4:06p     Stop: 4:56p     Time Spent: 50 min Location: In-person   Session narrative (presenting needs, interim history, self-report of stressors and symptoms, applications of prior therapy, status changes, and interventions made in session) Has word from [name] now that he has retained attorney, went back to [facility] so indications are that he is more ready for action, but also that he may be susceptible to being recruited into a more expensive fight than he want.  Finds herself really wanting to know -- but staying clear she doesn't have to know -- how it's going to turn out with [name] getting free of his marriage in a more decisive, final way.  Agreed she can only count on [name] investing in a relationship with her if he will see through getting out of the one he keeps saying is done with his wife.  Notes she has been dreaming often about Quillian Quince (her husband from years ago, moved on and married, three stepkids and three shared biological).  Often also includes knowledge, at least, of his going on with the other woman shortly after they broke up and coming to say things like how he never actually loved Tayonna.  Not distressing, not feeling like it's something pathological, just puzzled.  Interpreted as emotional brain trying to find ways of dealing with uncertainty in he current relationship by mining past experience, and that her saga with noncommittal, waffling, deceitful Quillian Quince is the prototype experience on her life of encountering the unreliable, self-deceived man.  Found herself asking herself again if she did everything she legitimately could with the marriage.  Agreed again she did, validated that it's OK to ask herself that again -- when it's about self-respect, it's a responsible thing to ask  if we did enough, and humane to forgive ourselves as many times as it takes to become natural.  That, plus she is still trying to recalibrate emotionally after so many double messages and inauthentic behavior from Lindsey over the course of their relationship so far.  Does not like living in [place], but the rent is still worth it, and she is finding that having her solo space is helpful after having lost herself moving in with [place].  Has been helpful, too, to commute to the office, so home does not become monotonous, largely feels more competent, alert, and positive for it.  Probed the effect of living situation on her impulses to settle the future of her relationship with [name], recognizes some press from feeling stuck in [place], but manageable.  Acknowledged strong wish to know how she can be settled for her future, what sort of foundation she can have for a home life.  Resolved to bear with the uncertainty, be sure not to pester for details, just OK to ask [name] how it's going.  Delayed plans to see "[employer]" coming up (tickets bought before pandemic), looking forward to a big date with [name].  Therapeutic modalities: Cognitive Behavioral Therapy, Solution-Oriented/Positive Psychology and Insight-Oriented  Mental Status/Observations:  Appearance:   Casual and Neat     Behavior:  Appropriate  Motor:  Normal  Speech/Language:   Clear and Coherent  Affect:  Appropriate  Mood:  normal  Thought process:  normal  Thought content:    WNL  Sensory/Perceptual disturbances:  WNL  Orientation:  Fully oriented  Attention:  Good    Concentration:  Fair  Memory:  WNL  Insight:    Good  Judgment:   Good  Impulse Control:  Good   Risk Assessment: Danger to Self: No Self-injurious Behavior: No Danger to Others: No Physical Aggression / Violence: No Duty to Warn: No Access to Firearms a concern: No  Assessment of progress:  progressing  Diagnosis:   ICD-10-CM   1. Generalized anxiety  disorder  F41.1   2. Adjustment disorder with mixed anxiety and depressed mood  F43.23   3. Relationship problem between partners  Z63.0   4. Recurrent major depressive disorder, in full remission ([place])  F33.42    Plan:  . Self-affirm dreams as normal attempts to deal with emotional dilemmas from the day . Maintain willingness to hold off pressing for details in Mike's divorce . Maintain willingness to wait for further commitment before making further commitment . Other recommendations/advice as may be noted above . Continue to utilize previously learned skills ad lib . Maintain medication as prescribed and work faithfully with relevant prescriber(s) if any changes are desired or seem indicated . Call the clinic on-call service, present to ER, or call 911 if any life-threatening psychiatric crisis Return for time at discretion, session(s) already scheduled. . Already scheduled visit in this office 04/21/2020.  Blanchie Serve, PhD [name], PhD LP Clinical Psychologist, [facility] [address] [place], [place] [address] [phone]

## 2020-04-12 ENCOUNTER — Ambulatory Visit: Payer: 59 | Admitting: Psychiatry

## 2020-04-21 ENCOUNTER — Encounter: Payer: Self-pay | Admitting: Physician Assistant

## 2020-04-21 ENCOUNTER — Ambulatory Visit (INDEPENDENT_AMBULATORY_CARE_PROVIDER_SITE_OTHER): Payer: 59 | Admitting: Physician Assistant

## 2020-04-21 ENCOUNTER — Other Ambulatory Visit: Payer: Self-pay

## 2020-04-21 DIAGNOSIS — F3341 Major depressive disorder, recurrent, in partial remission: Secondary | ICD-10-CM | POA: Diagnosis not present

## 2020-04-21 DIAGNOSIS — F411 Generalized anxiety disorder: Secondary | ICD-10-CM

## 2020-04-21 MED ORDER — SERTRALINE HCL 100 MG PO TABS: ORAL_TABLET | ORAL | 1 refills | Status: DC

## 2020-04-21 MED ORDER — ALPRAZOLAM 0.5 MG PO TABS
0.2500 mg | ORAL_TABLET | Freq: Three times a day (TID) | ORAL | 1 refills | Status: DC | PRN
Start: 2020-04-21 — End: 2020-10-20

## 2020-04-21 NOTE — Progress Notes (Signed)
[facility]  Patient ID: Ladean Steinmeyer,  MRN: [id number]  PCP: [facility]  Date of Evaluation: 04/21/2020 Time spent:20 minutes  Chief Complaint:  Chief Complaint    Anxiety; Depression      HISTORY/CURRENT STATUS: HPI for routine med check.  Doing really well.  Can't even remember when she took a Xanax! Likes to have them around just in case she needs them. Not having panic attacks hardly ever. Does have generalized anxiety sometimes but the Zoloft has helped tremendously.   Is able to enjoy things.  Denies decreased energy or motivation.  Appetite has not changed.  No extreme sadness, tearfulness, or feelings of hopelessness.  Denies any changes in concentration, making decisions or remembering things.  Denies suicidal or homicidal thoughts.  Patient denies increased energy with decreased need for sleep, no increased talkativeness, no racing thoughts, no impulsivity or risky behaviors, no increased spending, no increased libido, no grandiosity.  Denies dizziness, syncope, seizures, numbness, tingling, tremor, tics, unsteady gait, slurred speech, confusion. Denies muscle or joint pain, stiffness, or dystonia.  Individual Medical History/ Review of Systems: Changes? :No    Past medications for mental health diagnoses include: Prozac, trazodone, Xanax  Allergies: Iohexol, Penicillins, and Clindamycin  Current Medications:  Current Outpatient Medications:  .  ALPRAZolam (XANAX) 0.5 MG tablet, Take 0.5-1 tablets (0.25-0.5 mg total) by mouth 3 (three) times daily as needed for anxiety., Disp: 30 tablet, Rfl: 1 .  cetirizine (ZYRTEC) 10 MG tablet, Take 10 mg by mouth daily., Disp: , Rfl:  .  cholecalciferol (VITAMIN D3) 25 MCG (1000 UT) tablet, Take 5,000 Units by mouth daily., Disp: , Rfl:  .  metFORMIN (GLUCOPHAGE) 500 MG tablet, TK 2 TS PO D, Disp: , Rfl:  .  norethindrone-ethinyl estradiol-iron (MICROGESTIN FE,GILDESS FE,LOESTRIN FE) 1.5-30  MG-MCG tablet, Take 1 tablet by mouth at bedtime., Disp: , Rfl:  .  sertraline (ZOLOFT) 100 MG tablet, TAKE 1 TABLET(100 MG) BY MOUTH DAILY, Disp: 90 tablet, Rfl: 1 .  topiramate (TOPAMAX) 50 MG tablet, Take by mouth., Disp: , Rfl:  .  glycopyrrolate (ROBINUL-FORTE) 2 MG tablet, Take 1 tablet (2 mg total) by mouth 2 (two) times daily. (Patient not taking: Reported on 01/26/2019), Disp: 60 tablet, Rfl: 11 .  levETIRAcetam (KEPPRA XR) 500 MG 24 hr tablet, Take 2 tablets (1,000 mg total) by mouth at bedtime. (Patient not taking: Reported on 08/06/2018), Disp: 60 tablet, Rfl: 0 Medication Side Effects: none  Family Medical/ Social History: Changes? No   MENTAL HEALTH EXAM:  There were no vitals taken for this visit.There is no height or weight on file to calculate BMI.  General Appearance: Casual, Neat and Well Groomed  Eye Contact:  Good  Speech:  Clear and Coherent  Volume:  Normal  Mood:  Euthymic  Affect:  Appropriate  Thought Process:  Goal Directed and Descriptions of Associations: Intact  Orientation:  Full (Time, Place, and Person)  Thought Content: Logical   Suicidal Thoughts:  No  Homicidal Thoughts:  No  Memory:  WNL  Judgement:  Good  Insight:  Good  Psychomotor Activity:  Normal  Concentration:  Concentration: Good  Recall:  Good  Fund of Knowledge: Good  Language: Good  Assets:  Desire for Improvement  ADL's:  Intact  Cognition: WNL  Prognosis:  Good    DIAGNOSES:    ICD-10-CM   1. Recurrent major depressive disorder, in partial remission ([place])  F33.41   2. Generalized anxiety disorder  F41.1  Receiving Psychotherapy: Yes With Dr. [name]   RECOMMENDATIONS:  PDMP reviewed. I provided 20 minutes of face to face time during this encounter. I'm glad she's doing so well! Continue Zoloft 100 mg p.o. daily. Continue Xanax 0.5 mg, 1/2-1 p.o. 3 times daily as needed. Continue therapy with Dr. Rica Mote Return in 6 months.   Donnal Moat, PA-C

## 2020-04-29 ENCOUNTER — Ambulatory Visit: Payer: 59 | Admitting: Psychiatry

## 2020-05-13 ENCOUNTER — Other Ambulatory Visit: Payer: Self-pay

## 2020-05-13 ENCOUNTER — Ambulatory Visit (INDEPENDENT_AMBULATORY_CARE_PROVIDER_SITE_OTHER): Payer: 59 | Admitting: Psychiatry

## 2020-05-13 DIAGNOSIS — Z630 Problems in relationship with spouse or partner: Secondary | ICD-10-CM

## 2020-05-13 DIAGNOSIS — F411 Generalized anxiety disorder: Secondary | ICD-10-CM

## 2020-05-13 NOTE — Progress Notes (Signed)
Psychotherapy Progress Note [facility] 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  Responded to a solicitation for RNs (probably came from her [employer] profile, before she hired on her current job).  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    Glad to be making extra money, too, for "the reckoning day" with Pam Graham.  Since last seen, Pam Graham has put forth the idea that his son and his estranged wife will both be moving on, possibly to [place], and he's admitted being selfish by allowing Pam Graham to hang around in the house and benefit from her cooking/housework.  His father also just passed away, which may cloud things further.  Basically, knows she is financially stable, the big need is to get a clear picture of whether Pam Graham will or won't come available for a future together and commit.  The money also is very attractive as a way to afford another getaway to [place], especially if she decides to be single.  Knows she is running out of patience for vague, sounds-good messages.  Overall, feels ready to accept the verdict of his behavior in the near future whether she can or can't have a future with him, clearer that she is OK either way.  Affirmed and encouraged.  Discussed outlook for how and when to tell if it's time to "retire" the relationship and coached tactics for pulling out clearer statements from Pam Graham, primarily, some version of "Sounds good.  How would [you, I, we] tell it's about to come true?"  Family pitching a holiday get-together, but it feels too risky.   Supported in her judgment to keep lower exposure if she sees fit.  Therapeutic modalities: Cognitive Behavioral Therapy and Solution-Oriented/Positive Psychology  Mental Status/Observations:  Appearance:   Casual     Behavior:  Appropriate  Motor:  Normal  Speech/Language:   Clear and Coherent  Affect:  Appropriate  Mood:  normal and just tired  Thought process:  normal  Thought content:    WNL  Sensory/Perceptual disturbances:    WNL  Orientation:  Fully oriented  Attention:  Good    Concentration:  Good  Memory:  WNL  Insight:    Good  Judgment:   Good  Impulse Control:  Good   Risk Assessment: Danger to Self: No Self-injurious Behavior: No Danger to Others: No Physical Aggression / Violence: No Duty to Warn: No Access to Firearms a concern: No  Assessment of progress:  progressing well  Diagnosis:   ICD-10-CM   1. Generalized anxiety disorder - stable  F41.1   2. Relationship problem between partners  Z63.0    Plan:  . Maintain self-care while working overtime . Consider further her standards for how/when to tell it's time . Other recommendations/advice as may be noted above . Continue to utilize previously learned skills ad lib . Maintain medication as prescribed and work faithfully with relevant prescriber(s) if any changes are desired or seem indicated . Call the clinic on-call service, present to ER, or call 911 if any life-threatening psychiatric crisis Return for session(s) already scheduled. Marland Kitchen  Already scheduled visit in this office 05/27/2020.  Blanchie Serve, PhD [name], PhD LP Clinical Psychologist, [facility] [address], [place], [place] [address] [phone]

## 2020-05-20 ENCOUNTER — Other Ambulatory Visit: Payer: Self-pay | Admitting: Physician Assistant

## 2020-05-27 ENCOUNTER — Ambulatory Visit: Payer: 59 | Admitting: Psychiatry

## 2020-05-27 ENCOUNTER — Other Ambulatory Visit: Payer: Self-pay

## 2020-06-10 ENCOUNTER — Ambulatory Visit: Payer: 59 | Admitting: Psychiatry

## 2020-06-23 ENCOUNTER — Ambulatory Visit (INDEPENDENT_AMBULATORY_CARE_PROVIDER_SITE_OTHER): Payer: 59 | Admitting: Psychiatry

## 2020-06-23 DIAGNOSIS — Z630 Problems in relationship with spouse or partner: Secondary | ICD-10-CM | POA: Diagnosis not present

## 2020-06-23 DIAGNOSIS — F411 Generalized anxiety disorder: Secondary | ICD-10-CM

## 2020-06-23 DIAGNOSIS — F4323 Adjustment disorder with mixed anxiety and depressed mood: Secondary | ICD-10-CM | POA: Diagnosis not present

## 2020-06-23 NOTE — Progress Notes (Signed)
Psychotherapy Progress Note [facility] Marliss Czar, PhD LP  Patient ID: Pam Graham     MRN: [id number]  Therapy format: Individual psychotherapy Date: 06/23/2020      Start: 4:30p     Stop: 5:30p     Time Spent: 45 min (remainder donated) Location: Telehealth visit -- I connected with this patient by an approved telecommunication method (video), with her informed consent, and verifying identity and patient privacy.  I was located at my office and patient at her home.  As needed, we discussed the limitations, risks, and security and privacy concerns associated with telehealth service, including the availability and conditions which currently govern in-person appointments and the possibility that 3rd-party payment may not be fully guaranteed and she may be responsible for charges.  After she indicated understanding, we proceeded with the session.  Also discussed treatment planning, as needed, including ongoing verbal agreement with the plan, the opportunity to ask and answer all questions, her demonstrated understanding of instructions, and her readiness to call the office should symptoms worsen or she feels she is in a crisis state and needs more immediate and tangible assistance.   Session narrative (presenting needs, interim history, self-report of stressors and symptoms, applications of prior therapy, status changes, and interventions made in session) Rough week with ongoing situation with Pam Graham, his supposedly estranged wife Pam Graham, and the nebulous, drawn-out drama of Pam Graham allegedly going to move out and status of actual relationship.  Did try the question how he would tell that it is, as he says, "working out", but no clear answer.  Two weeks ago, Pam Graham visited Pam Graham's place, and when she asked the latest question how things stand with Pam Graham moving out, it prompted a new blow-up from him about not being willing to take this and how he'd already felt the next time she  mentions it, he's done.  He went MIA for a few days, then he started texting again, she allowed it, they have not processed the incident, and next plans were going to the [employer] and spending the night together.  While there, she looked through his phone, found another round of "dick pics" sent to other women and evidence of him assigning money from inheritance (father died Graham 25, 2024) to people.  Also found self-revelation about his "wandering eye" in text to a work friend.    Struggling with the fact she let the relationship revive again without better assurances and gave back in to spending the night with him, abrogating her own standards.  Since Tuesday she's been home in bed dysphoric -- partly for standing up and making the decision again that they won't be involved until Pam Graham is out of the house, partly for putting up with his virtual promiscuity, and partly for last night breaking 3 days' silence herself to ask how it's going and getting blamed for pestering him.  Today he sends her a message telling her he's trying to masturbate but it's not working, he needs her help, as if nothing has happened.  Discussed outlook for the relationship, standards she wants to maintain, how to frame messages.  Still wants to continue, but only after he has resolved estranged wife living with him.  Therapeutic modalities: Cognitive Behavioral Therapy and Solution-Oriented/Positive Psychology  Mental Status/Observations:  Appearance:   Casual     Behavior:  Appropriate  Motor:  Normal  Speech/Language:   Clear and Coherent  Affect:  Appropriate  Mood:  depressed  Thought process:  normal  Thought  content:    toxic attachment  Sensory/Perceptual disturbances:    WNL  Orientation:  Fully oriented  Attention:  Good    Concentration:  Good  Memory:  WNL  Insight:    Good  Judgment:   Fair  Impulse Control:  Fair   Risk Assessment: Danger to Self: No Self-injurious Behavior: No Danger to Others:  No Physical Aggression / Violence: No Duty to Warn: No Access to Firearms a concern: No  Assessment of progress:  situational setback(s)  Diagnosis:   ICD-10-CM   1. Generalized anxiety disorder - stable  F41.1   2. Relationship problem between partners  Z63.0   3. Adjustment disorder with mixed anxiety and depressed mood  F43.23    Plan:  . Further clarification of values and standards for relationship with Pam Graham -- it's not all about his estranged wife being present . Manage health conditions as agreed elsewhere . Other recommendations/advice as may be noted above . Continue to utilize previously learned skills ad lib . Maintain medication as prescribed and work faithfully with relevant prescriber(s) if any changes are desired or seem indicated . Call the clinic on-call service, present to ER, or call 911 if any life-threatening psychiatric crisis Return in about 2 weeks (around 07/07/2020) for session(s) already scheduled. . Already scheduled visit in this office 07/05/2020.  Robley Fries, PhD Marliss Czar, PhD LP Clinical Psychologist, [facility] [address], [address] [place], [place] [address] [phone]

## 2020-07-05 ENCOUNTER — Ambulatory Visit: Payer: 59 | Admitting: Psychiatry

## 2020-07-22 ENCOUNTER — Ambulatory Visit: Payer: 59 | Admitting: Psychiatry

## 2020-07-27 ENCOUNTER — Other Ambulatory Visit: Payer: Self-pay

## 2020-07-27 ENCOUNTER — Ambulatory Visit (INDEPENDENT_AMBULATORY_CARE_PROVIDER_SITE_OTHER): Payer: 59 | Admitting: Psychiatry

## 2020-07-27 DIAGNOSIS — F411 Generalized anxiety disorder: Secondary | ICD-10-CM

## 2020-07-27 DIAGNOSIS — E282 Polycystic ovarian syndrome: Secondary | ICD-10-CM

## 2020-07-27 DIAGNOSIS — F4323 Adjustment disorder with mixed anxiety and depressed mood: Secondary | ICD-10-CM

## 2020-07-27 DIAGNOSIS — Z630 Problems in relationship with spouse or partner: Secondary | ICD-10-CM

## 2020-07-27 NOTE — Progress Notes (Signed)
Psychotherapy Progress Note [facility] [name], PhD LP  Patient ID: Pam Graham     MRN: [id number] Therapy format: Individual psychotherapy Date: 07/27/2020      Start: 5:04p     Stop: 5:54p     Time Spent: 50 min Location: In-person   Session narrative (presenting needs, interim history, self-report of stressors and symptoms, applications of prior therapy, status changes, and interventions made in session) Recent cancellations and RS to get out of town.  Twice to the [facility] for desperately needed R&R, got spa treatment, helped recharge.  Has been dealing with irritable, middle-of-the-night texts from [name] c/o lack of sex, when she has already made clear she has standards, and his wife moving out of the house is one of them.  Came to the point of blocking him, first time in their 3 years off and on together.  Aware he has a chronic sleep disorder due to Parkinson's, but it is unacceptable to approach her essentially as an object, a means to an end.  Despite assertiveness, has found herself retreating from in-person to telework a number of days, trying to bargain down dealing with people.  Went on site today, to her credit.  Encouraged continue healthy change of scenery and being more present in order to get out of her thoughts more.    One night [name] showed up by surprise -- he was in a positive mood and she was sexually deprived, so they ended up having a compelling time together.  Not long afterward, he shared that wife (supposedly estranged) "got angry" and kicked him out ... of what has been his own house for many months now.  Last week, he came over a couple nights, but 2nd night Pam Graham was bone tired, fell asleep, woke up and woke to find he left without a message.  This despite plans to get away together the next day.  Next day irritable message explaining he couldn't sleep, went home to get stuff, fell asleep, and then "don't make anything out of it that it's  not."  She decided to keep the plans for herself only, went on to [place], and he wouldn't take her calls initially but then would give her news of shopping for an expensive car with inheritance money, then declaring they weren't going to make it, started crabbing about her "using sex as a weapon", and otherwise martyring.  This week, she has purged her apartment of his things -- including his "cock medicine" (Mike's word) -- as he said caustic, unfair things, only for him to eventually acknowledge he was trying to drive her away, because she deserves better, etc.    Reviewed exchanges and attributions, agreed [name] is complex, conflicted, very troubled, dominated by depression, and will continue to project anger onto her intermittently until his Parkinson's takes him into sufficient dementia to lose the energy.  She really needs to decide whether it's worth how he does, how unlikely it is that he will overcome his issues in time to enjoy any kind of life together.  Therapeutic modalities: Cognitive Behavioral Therapy and Solution-Oriented/Positive Psychology  Mental Status/Observations:  Appearance:   Casual     Behavior:  Appropriate  Motor:  Normal  Speech/Language:   Clear and Coherent  Affect:  Appropriate  Mood:  dysthymic  Thought process:  normal  Thought content:    toxic attachment   Sensory/Perceptual disturbances:    WNL  Orientation:  Fully oriented  Attention:  Good  Concentration:  Good  Memory:  WNL  Insight:    Good  Judgment:   Fair  Impulse Control:  Fair   Risk Assessment: Danger to Self: No Self-injurious Behavior: No Danger to Others: No Physical Aggression / Violence: No Duty to Warn: No Access to Firearms a concern: No  Assessment of progress:  stabilized  Diagnosis:   ICD-10-CM   1. Generalized anxiety disorder - stable  F41.1   2. Adjustment disorder with mixed anxiety and depressed mood  F43.23   3. Relationship problem between partners  Z63.0   4.  PCOS (polycystic ovarian syndrome)  E28.2    Plan:   Further clarification of values and standards for relationship with [name] -- advise against renewing the relationship  Manage health conditions as agreed elsewhere . Other recommendations/advice as may be noted above . Continue to utilize previously learned skills ad lib . Maintain medication as prescribed and work faithfully with relevant prescriber(s) if any changes are desired or seem indicated . Call the clinic on-call service, present to ER, or call 911 if any life-threatening psychiatric crisis No follow-ups on file. . Already scheduled visit in this office 10/20/2020.  Blanchie Serve, PhD [name], PhD LP Clinical Psychologist, [facility] [address], [place], [place] [address] [phone]

## 2020-08-29 ENCOUNTER — Ambulatory Visit: Payer: 59 | Admitting: Psychiatry

## 2020-08-29 DIAGNOSIS — Z20822 Contact with and (suspected) exposure to covid-19: Secondary | ICD-10-CM

## 2020-08-29 DIAGNOSIS — F411 Generalized anxiety disorder: Secondary | ICD-10-CM

## 2020-08-29 NOTE — Progress Notes (Signed)
Admin note for non-service contact  Patient ID: Manette Doto  MRN: [id number] DATE: 08/29/2020  Technical difficulties establishing video -- CHL indicated Lachanda was on but no 2-way video would establish in repeated tries.  Left voicemail, reached by text.  Had left VM with office, unbeknownst, reporting she was nauseous and headache and needed to go home from workplace.  Possibly related to recent COVID exposure.  May RS no penalty.  Blanchie Serve, PhD [name], PhD LP Clinical Psychologist, [facility] [address], [place], [place] [address] [phone]

## 2020-09-12 ENCOUNTER — Other Ambulatory Visit: Payer: Self-pay

## 2020-09-12 ENCOUNTER — Ambulatory Visit (INDEPENDENT_AMBULATORY_CARE_PROVIDER_SITE_OTHER): Payer: 59 | Admitting: Psychiatry

## 2020-09-12 DIAGNOSIS — F411 Generalized anxiety disorder: Secondary | ICD-10-CM | POA: Diagnosis not present

## 2020-09-12 DIAGNOSIS — F4323 Adjustment disorder with mixed anxiety and depressed mood: Secondary | ICD-10-CM

## 2020-09-12 DIAGNOSIS — Z630 Problems in relationship with spouse or partner: Secondary | ICD-10-CM | POA: Diagnosis not present

## 2020-09-12 NOTE — Progress Notes (Signed)
Psychotherapy Progress Note [facility] [name], PhD LP  Patient ID: Pam Graham     MRN: [id number] Therapy format: Individual psychotherapy Date: 09/12/2020      Start: 4:20p     Stop: 5:10p     Time Spent: 50 min Location: In-person    Session narrative (presenting needs, interim history, self-report of stressors and symptoms, applications of prior therapy, status changes, and interventions made in session) Considering whether she is in midlife crisis, but basically articulates a problem with work going stale and again feeling like bargaining it down, cutting corners, avoiding where possible without taking leave.  No mention at all of [name].  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  Finds herself so bored and numbed that she started snoozing mornings, putting herself on the brink of lateness, and taking opportunities to take computer home and work, in violation of policy for her position.  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    Interpreted both a fundamental boredom with her role now and recent exacerbation by the time change making it physiologically harder to rise and meet her intended work day.  Apart from light levels and  circadian accommodation, framed her main need to either work up a way of making the work more interesting or find more interesting work.  Discussed times when the work makes itself more interesting -- complex stress situations, general nursing needs that show up in the midst of conversation with employees, and times when her knowledge of how HR processes work make her an Magazine features editor" voice what another employee can do about a situation.  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  Therapeutic modalities: Cognitive Behavioral Therapy, Solution-Oriented/Positive Psychology and Humanistic/Existential  Mental Status/Observations:  Appearance:   Casual and Neat     Behavior:  Appropriate  Motor:  Normal  Speech/Language:   Clear and Coherent  Affect:  Appropriate  Mood:  dysthymic, responsive to humor and problem-solving  Thought process:  normal  Thought content:    WNL  Sensory/Perceptual disturbances:    WNL  Orientation:  Fully oriented  Attention:  Good    Concentration:  Good  Memory:  WNL  Insight:    Good  Judgment:   Good  Impulse Control:  Fair   Risk Assessment: Danger to Self: No Self-injurious Behavior: No Danger to Others: No Physical Aggression / Violence: No Duty to Warn: No Access to Firearms a concern: No  Assessment of progress:  situational setback(s)  Diagnosis:   ICD-10-CM   1. Adjustment disorder with mixed anxiety and depressed mood  F43.23   2. Generalized anxiety disorder - stable  F41.1   3.  Relationship problem between partners  Z63.0    Plan:  . Try ideas for spicing up work experience . Option to seek other work meanwhile . Return to update assessment of relationship, boundaries, and chronic stress with [name] . Other recommendations/advice as may be noted above . Continue to utilize previously learned skills ad lib . Maintain medication as prescribed and work faithfully with relevant prescriber(s) if any changes are desired or seem indicated . Call the clinic on-call service, present to ER, or call 911 if any life-threatening psychiatric crisis Return in about 2 weeks (around 09/26/2020). . Already scheduled visit in this office 09/23/2020.  Blanchie Serve, PhD [name], PhD LP Clinical Psychologist, [facility] [address], [place], [place] [address] [phone]

## 2020-09-23 ENCOUNTER — Other Ambulatory Visit: Payer: Self-pay

## 2020-09-23 ENCOUNTER — Ambulatory Visit (INDEPENDENT_AMBULATORY_CARE_PROVIDER_SITE_OTHER): Payer: 59 | Admitting: Psychiatry

## 2020-09-23 DIAGNOSIS — F3341 Major depressive disorder, recurrent, in partial remission: Secondary | ICD-10-CM

## 2020-09-23 DIAGNOSIS — Z630 Problems in relationship with spouse or partner: Secondary | ICD-10-CM | POA: Diagnosis not present

## 2020-09-23 DIAGNOSIS — F411 Generalized anxiety disorder: Secondary | ICD-10-CM | POA: Diagnosis not present

## 2020-09-23 DIAGNOSIS — F4323 Adjustment disorder with mixed anxiety and depressed mood: Secondary | ICD-10-CM | POA: Diagnosis not present

## 2020-09-23 NOTE — Progress Notes (Signed)
Psychotherapy Progress Note [facility] Pam Moore, PhD LP  Patient ID: Pam Graham     MRN: [id number] Therapy format: Individual psychotherapy Date: 09/23/2020      Start: 3:20p     Stop: 4:10p     Time Spent: 50 min Location: In-person   Session narrative (presenting needs, interim history, self-report of stressors and symptoms, applications of prior therapy, status changes, and interventions made in session) Lab work done, found low B12 yesterday, PCP recommended supplement.  Discussed advantage of B complex over unitary B12.  On vitamin D already, worth continuing.  Been planning Pam Graham trip for May, was with hope of Pam Graham accompanying her.  His Parkinson's has accelerated this year, trouble walking, "furniture surfing" (hands on furniture to steady), cogwheeling, which drove reconsideration (possibly Pam Graham), then cancel altogether.  Finding herself worn out, confessing to him she can't continue this, worn out.  (part B12, part futility of the situation).  Latest idea form Pam Graham is that they will go get an apartment together for 6 months and then his estranged wife Pam Graham living in the house will just move out and they can move back in the two of them.  Since then, depressed talk about not being able to do things, and a cryptic "Take care of yourself" message she took as sarcastic (he's capable), but may have been more of a morose, "better off without me" thought, confessed it won't work, switched subjects to how sexually deprived he is (offensive to her).  Basically Pam Graham's position that 3 years is enough trying to accomplish the impossible -- a theme she's had with other men.  Today, he texts her that he has a plan, wants to talk this weekend.  Thoughts now of Pam Graham, Pam Graham man she met when there before.  Planning actually to see him again next she goes over.   Reviewed signs of Pam Graham's feelings, interpretation of his intentions, and her own readiness to manage  expectations.  Therapeutic modalities: Cognitive Behavioral Therapy, Solution-Oriented/Positive Psychology, and Ego-Supportive  Mental Status/Observations:  Appearance:   Casual     Behavior:  Appropriate  Motor:  Normal  Speech/Language:   Clear and Coherent  Affect:  Appropriate  Mood:  normal and some down, but very responsive  Thought process:  normal  Thought content:    WNL  Sensory/Perceptual disturbances:    WNL  Orientation:  Fully oriented  Attention:  Good    Concentration:  Fair  Memory:  WNL  Insight:    Good  Judgment:   Good  Impulse Control:  Good   Risk Assessment: Danger to Self: No Self-injurious Behavior: No Danger to Others: No Physical Aggression / Violence: No Duty to Warn: No Access to Firearms a concern: No  Assessment of progress:  progressing  Diagnosis:   ICD-10-CM   1. Adjustment disorder with mixed anxiety and depressed mood  F43.23     2. Relationship problem between partners  Z63.0     3. Generalized anxiety disorder - stable  F41.1     4. Recurrent major depressive disorder, in partial remission (Pam Graham)  F33.41      Plan:  Add B complex, continue D3 OK to make her own plans -- no guilt on Pam Graham's account Stay ready to question, ready to assist if committed, and ready to take grouchy as depressed and inquire how he wants things to go Other recommendations/advice as may be noted above Continue to utilize previously learned skills ad lib Maintain medication as prescribed  and work faithfully with relevant prescriber(s) if any changes are desired or seem indicated Call the clinic on-call service, present to ER, or call 911 if any life-threatening psychiatric crisis Return for time as available. Already scheduled visit in this office 09/28/2020.  Pam Serve, PhD Pam Moore, PhD LP Clinical Psychologist, [facility] [address] [place], [place] [address] [phone]

## 2020-09-28 ENCOUNTER — Ambulatory Visit: Payer: 59 | Admitting: Psychiatry

## 2020-10-11 ENCOUNTER — Ambulatory Visit: Payer: 59 | Admitting: Psychiatry

## 2020-10-20 ENCOUNTER — Ambulatory Visit (INDEPENDENT_AMBULATORY_CARE_PROVIDER_SITE_OTHER): Payer: 59 | Admitting: Physician Assistant

## 2020-10-20 ENCOUNTER — Other Ambulatory Visit: Payer: Self-pay

## 2020-10-20 ENCOUNTER — Encounter: Payer: Self-pay | Admitting: Physician Assistant

## 2020-10-20 DIAGNOSIS — F411 Generalized anxiety disorder: Secondary | ICD-10-CM

## 2020-10-20 DIAGNOSIS — F3342 Major depressive disorder, recurrent, in full remission: Secondary | ICD-10-CM

## 2020-10-20 MED ORDER — SERTRALINE HCL 100 MG PO TABS: 100.0000 mg | ORAL_TABLET | Freq: Every day | ORAL | 1 refills | Status: DC

## 2020-10-20 MED ORDER — ALPRAZOLAM 0.5 MG PO TABS: 0.2500 mg | ORAL_TABLET | Freq: Three times a day (TID) | ORAL | 1 refills | Status: DC | PRN

## 2020-10-20 NOTE — Progress Notes (Signed)
[facility]  Patient ID: Pam Graham,  MRN: [id number]  PCP: [facility], [facility]  Date of Evaluation: 10/20/2020 Time spent:20 minutes  Chief Complaint:  Chief Complaint    Depression      HISTORY/CURRENT STATUS: HPI For 6 month med check.  Doing well except tired all the time. Saw PCP today for that and is waiting on f/u labs.  Looking forward to going to [place] in a few weeks.  Will be there for 2 weeks.  Patient denies loss of interest in usual activities and is able to enjoy things.  Denies decreased energy or motivation.  Appetite has not changed.  No extreme sadness, tearfulness, or feelings of hopelessness.  Denies any changes in concentration, making decisions or remembering things.  She does get anxious sometimes but not real, and.  Takes Xanax on very rare occasions.  Denies suicidal or homicidal thoughts.  Patient denies increased energy with decreased need for sleep, no increased talkativeness, no racing thoughts, no impulsivity or risky behaviors, no increased spending, no increased libido, no grandiosity, no increased irritability or anger, and no hallucinations.  Denies dizziness, syncope, seizures, numbness, tingling, tremor, tics, unsteady gait, slurred speech, confusion. Denies muscle or joint pain, stiffness, or dystonia.  Individual Medical History/ Review of Systems: Changes? :No   Past medications for mental health diagnoses include: Prozac, trazodone, Xanax  Allergies: Iohexol, Penicillins, and Clindamycin  Current Medications:  Current Outpatient Medications:  .  cetirizine (ZYRTEC) 10 MG tablet, Take 10 mg by mouth daily., Disp: , Rfl:  .  cholecalciferol (VITAMIN D3) 25 MCG (1000 UT) tablet, Take 5,000 Units by mouth daily., Disp: , Rfl:  .  metFORMIN (GLUCOPHAGE) 500 MG tablet, TK 2 TS PO D, Disp: , Rfl:  .  norethindrone-ethinyl estradiol-iron (MICROGESTIN FE,GILDESS FE,LOESTRIN FE) 1.5-30 MG-MCG tablet, Take 1 tablet by  mouth at bedtime., Disp: , Rfl:  .  ALPRAZolam (XANAX) 0.5 MG tablet, Take 0.5-1 tablets (0.25-0.5 mg total) by mouth 3 (three) times daily as needed for anxiety., Disp: 30 tablet, Rfl: 1 .  glycopyrrolate (ROBINUL-FORTE) 2 MG tablet, Take 1 tablet (2 mg total) by mouth 2 (two) times daily. (Patient not taking: No sig reported), Disp: 60 tablet, Rfl: 11 .  levETIRAcetam (KEPPRA XR) 500 MG 24 hr tablet, Take 2 tablets (1,000 mg total) by mouth at bedtime. (Patient not taking: No sig reported), Disp: 60 tablet, Rfl: 0 .  sertraline (ZOLOFT) 100 MG tablet, Take 1 tablet (100 mg total) by mouth daily., Disp: 90 tablet, Rfl: 1 .  topiramate (TOPAMAX) 50 MG tablet, Take by mouth., Disp: , Rfl:  Medication Side Effects: none  Family Medical/ Social History: Changes? no  MENTAL HEALTH EXAM:  There were no vitals taken for this visit.There is no height or weight on file to calculate BMI.  General Appearance: Casual and Neat  Eye Contact:  Good  Speech:  Clear and Coherent and Normal Rate  Volume:  Normal  Mood:  Euthymic  Affect:  Appropriate  Thought Process:  Goal Directed and Descriptions of Associations: Circumstantial  Orientation:  Full (Time, Place, and Person)  Thought Content: Logical   Suicidal Thoughts:  No  Homicidal Thoughts:  No  Memory:  WNL  Judgement:  Good  Insight:  Good  Psychomotor Activity:  Normal  Concentration:  Concentration: Good  Recall:  Good  Fund of Knowledge: Good  Language: Good  Assets:  Desire for Improvement  ADL's:  Intact  Cognition: WNL  Prognosis:  Good  DIAGNOSES:    ICD-10-CM   1. Generalized anxiety disorder - stable  F41.1   2. Recurrent major depressive disorder, in full remission ([place])  F33.42     Receiving Psychotherapy: Yes    RECOMMENDATIONS:  PDMP was reviewed. I provided 20 minutes of face-to-face time during this encounter, including time spent before and after the visit and records review and charting. From a psychiatric  standpoint, she is doing well so no medication changes are needed.  I hope the work-up at her PCP's office will be informative and a plan can be made to help with the fatigue. Continue Xanax 0.5 mg, 1/2-1 p.o. 3 times daily as needed. Continue Zoloft 100 mg, 1 p.o. daily. Continue therapy. Return in 6 months.  Donnal Moat, PA-C

## 2020-10-24 ENCOUNTER — Ambulatory Visit: Payer: 59 | Admitting: Psychiatry

## 2020-11-01 ENCOUNTER — Other Ambulatory Visit: Payer: Self-pay | Admitting: Orthopedic Surgery

## 2020-11-01 IMAGING — DX DG CHEST 2V
2 series · 2 of 2 positions shown · non-contrast
Comparison: July 19, 2005

CLINICAL DATA: Chest pain.  Chest heaviness.  Shortness of breath.

EXAM:
CHEST - 2 VIEW

[chest pa]
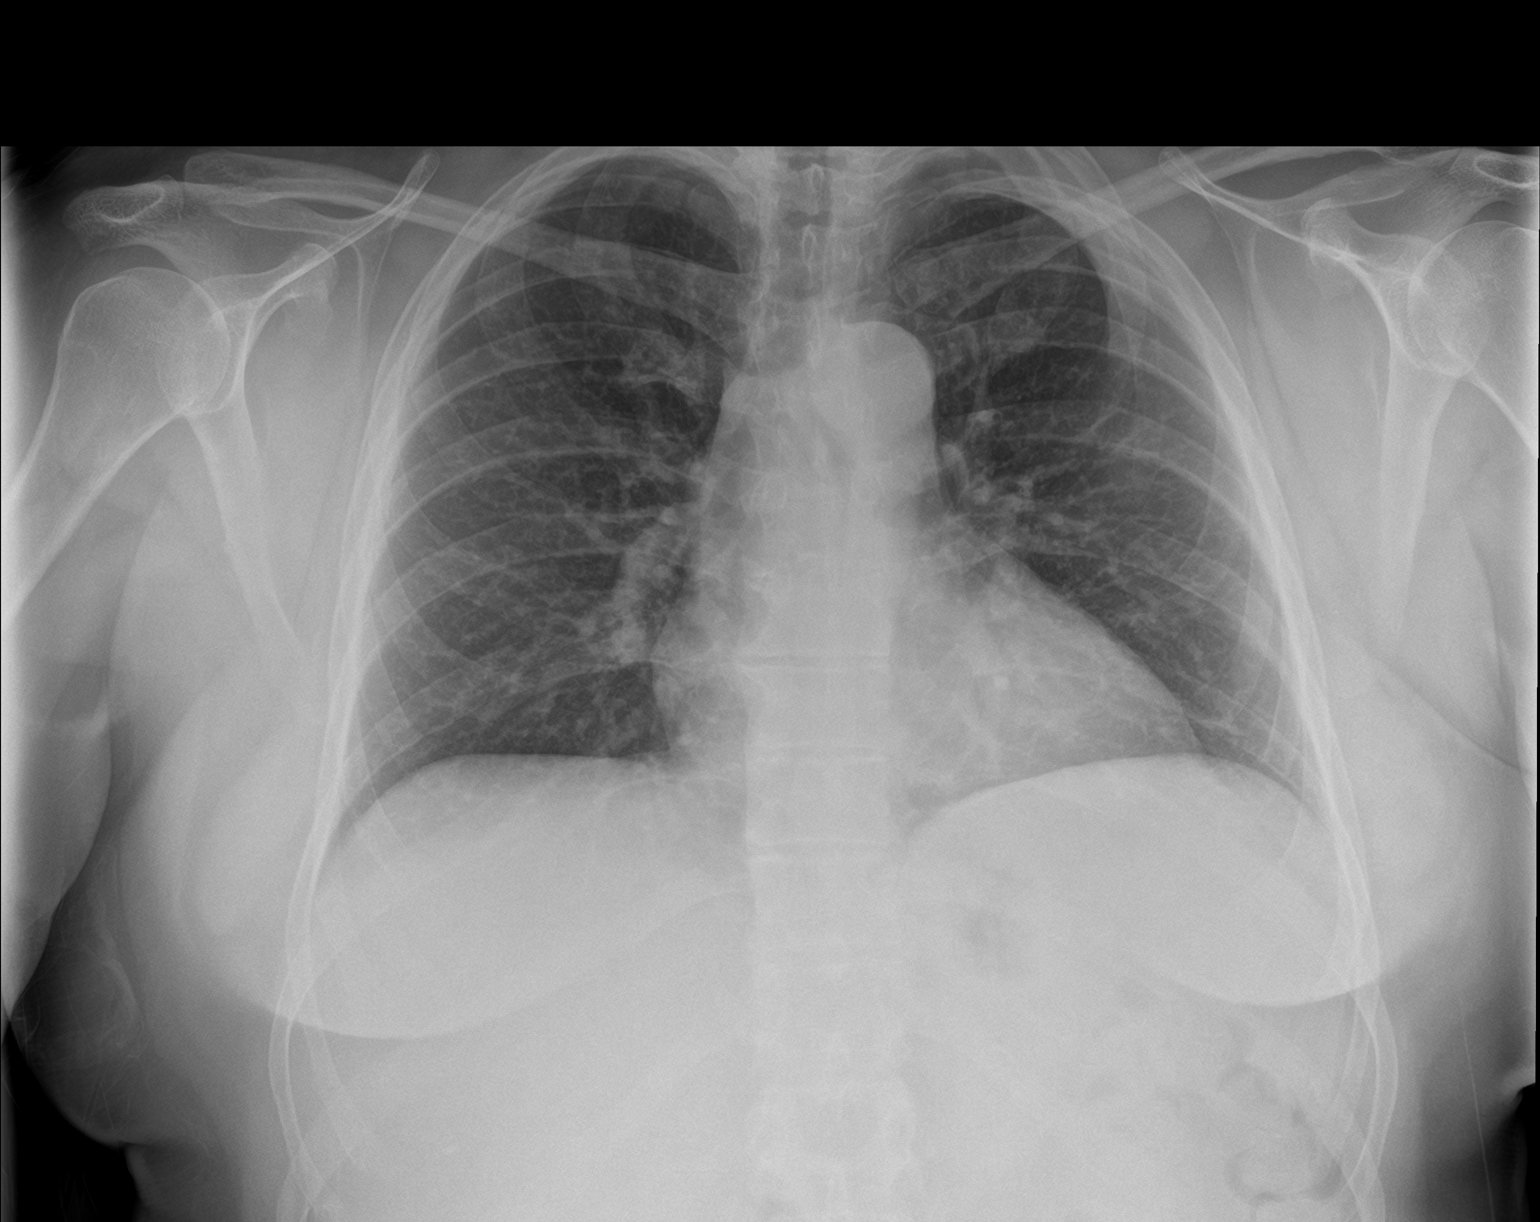

[chest lat]
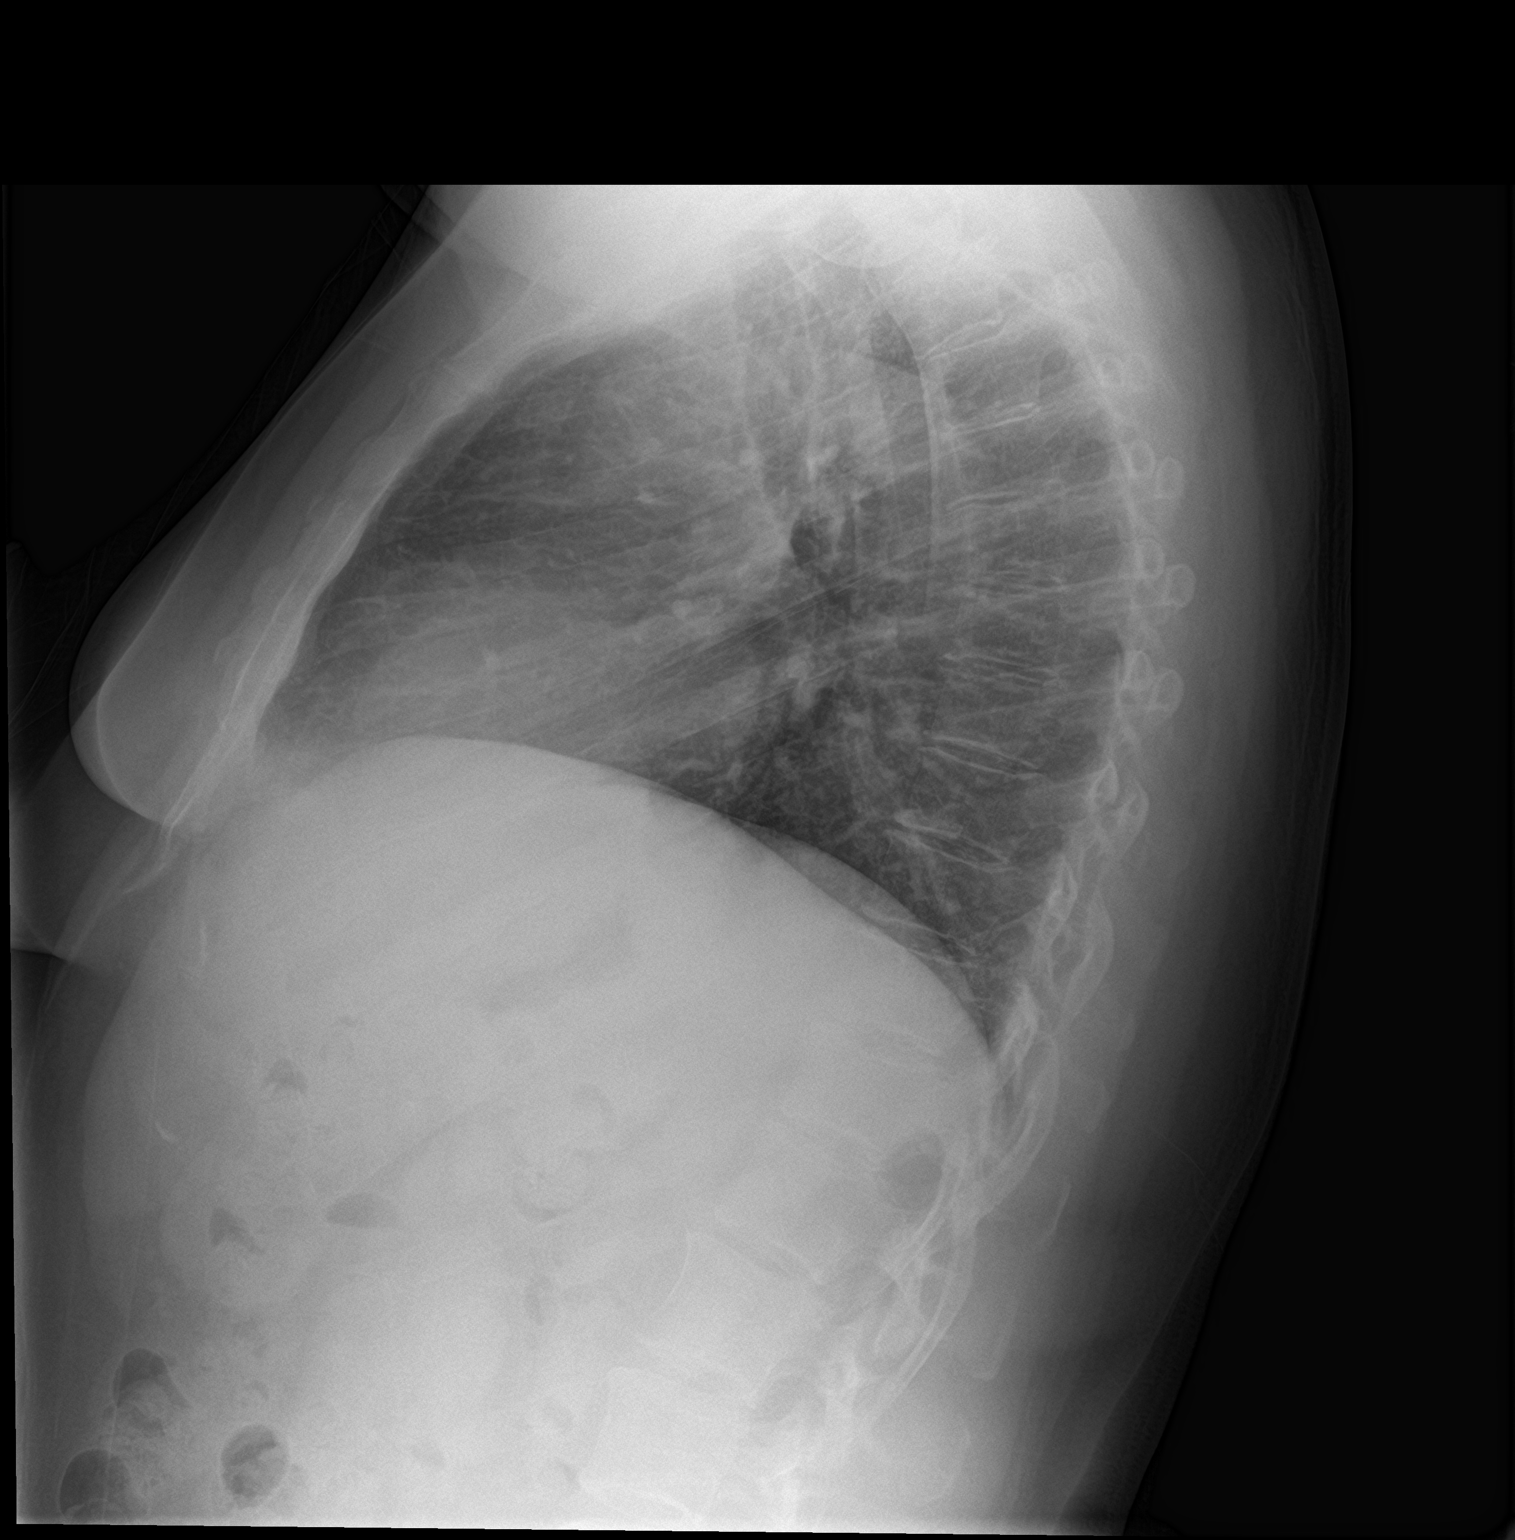

[2 of 2 positions shown; findings below may reference images not displayed]

FINDINGS: The heart size and mediastinal contours are within normal limits.
Both lungs are clear. The visualized skeletal structures are
unremarkable.
IMPRESSION: No active cardiopulmonary disease.

## 2020-11-07 ENCOUNTER — Ambulatory Visit (INDEPENDENT_AMBULATORY_CARE_PROVIDER_SITE_OTHER): Payer: 59 | Admitting: Psychiatry

## 2020-11-07 ENCOUNTER — Other Ambulatory Visit: Payer: Self-pay

## 2020-11-07 DIAGNOSIS — R7989 Other specified abnormal findings of blood chemistry: Secondary | ICD-10-CM | POA: Diagnosis not present

## 2020-11-07 DIAGNOSIS — Z630 Problems in relationship with spouse or partner: Secondary | ICD-10-CM | POA: Diagnosis not present

## 2020-11-07 DIAGNOSIS — F3342 Major depressive disorder, recurrent, in full remission: Secondary | ICD-10-CM | POA: Diagnosis not present

## 2020-11-07 DIAGNOSIS — E039 Hypothyroidism, unspecified: Secondary | ICD-10-CM

## 2020-11-07 DIAGNOSIS — E282 Polycystic ovarian syndrome: Secondary | ICD-10-CM

## 2020-11-07 DIAGNOSIS — D509 Iron deficiency anemia, unspecified: Secondary | ICD-10-CM

## 2020-11-07 DIAGNOSIS — F411 Generalized anxiety disorder: Secondary | ICD-10-CM

## 2020-11-07 DIAGNOSIS — E538 Deficiency of other specified B group vitamins: Secondary | ICD-10-CM

## 2020-11-07 NOTE — Progress Notes (Signed)
Psychotherapy Progress Note [facility] [name], PhD LP  Patient ID: Pam Graham     MRN: [id number] Therapy format: Individual psychotherapy Date: 11/07/2020      Start: 4:04p     Stop: 4:49p     Time Spent: 45 min Location: In-person   Session narrative (presenting needs, interim history, self-report of stressors and symptoms, applications of prior therapy, status changes, and interventions made in session) B12 and D3 were low, and supplementation has worked according to testing, but thyroid panel off (low T3).  Symptoms match underactive thyroid, looking into it with doctor.  Family hx + for thyroid disorders.  Rx'd Armor Thyroid, just began it today.  Iron saturation is low.  Nervous dealing with fatigue and experimenting with medication when he is a week and a half from going to [place].  Got a friendly consultation with hematologist/oncologist, suggested it's PCOS, which she has.  Looked into endocrinology but can't be seen before travel.  Today, on first dose, having some anxiety and breathing discomfort.    Will be getting surgery to release trigger finger.  After return from [place].    Consulted further about communication with Pam Graham, reading his intentions, still murky with his separated wife living in the house.  Therapeutic modalities: Cognitive Behavioral Therapy, Solution-Oriented/Positive Psychology, and Ego-Supportive  Mental Status/Observations:  Appearance:   Casual     Behavior:  Appropriate  Motor:  Normal  Speech/Language:   Clear and Coherent  Affect:  Appropriate and brighter  Mood:  normal  Thought process:  normal  Thought content:    WNL  Sensory/Perceptual disturbances:    WNL  Orientation:  Fully oriented  Attention:  Good    Concentration:  Good  Memory:  WNL  Insight:    Good  Judgment:   Good  Impulse Control:  Good   Risk Assessment: Danger to Self: No Self-injurious Behavior: No Danger to Others: No Physical  Aggression / Violence: No Duty to Warn: No Access to Firearms a concern: No  Assessment of progress:  progressing  Diagnosis:   ICD-10-CM   1. Generalized anxiety disorder - stable  F41.1     2. Relationship problem between partners  Z63.0     3. Recurrent major depressive disorder, in full remission ([place])  F33.42     4. Low vitamin D level  R79.89     5. Low vitamin B12 level  E53.8     6. PCOS (polycystic ovarian syndrome)  E28.2     7. Hypothyroidism, unspecified type  E03.9     8. Iron deficiency anemia, unspecified iron deficiency anemia type  D50.9      Plan:  Continued caution about expectations and hopes with Pam Graham, who has a long history now of say one thing, contradict himself and now-remote (?) history of acting out with other women Tend physiological conditions as directed, improve nutrition as needed Other recommendations/advice as may be noted above Continue to utilize previously learned skills ad lib Maintain medication as prescribed and work faithfully with relevant prescriber(s) if any changes are desired or seem indicated Call the clinic on-call service, present to ER, or call 911 if any life-threatening psychiatric crisis Return for time at discretion. Already scheduled visit in this office 12/20/2020.  Blanchie Serve, PhD [name], PhD LP Clinical Psychologist, [facility] [address] [place], [place] [address] [phone]

## 2020-12-07 IMAGING — NM NM HEPATO W/GB/PHARM/[PERSON_NAME]
2 series · 12 of 12 positions shown · non-contrast
Comparison: None

CLINICAL DATA: Four month history of abdominal pain and nausea,
history of biliary sludge

EXAM:
NUCLEAR MEDICINE HEPATOBILIARY IMAGING WITH GALLBLADDER EF
TECHNIQUE: Sequential images of the abdomen were obtained [DATE] minutes
following intravenous administration of radiopharmaceutical. After
oral ingestion of Ensure, gallbladder ejection fraction was
determined. At 60 min, normal ejection fraction is greater than 33%.
RADIOPHARMACEUTICALS:  5.2 mCi Wc-IIm  Choletec IV

[Series 1: biliary · 3.25mm/px · 6 of 60 frames shown]
[frame 6/60]
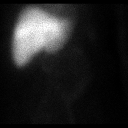
[frame 16/60]
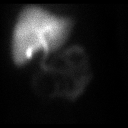
[frame 26/60]
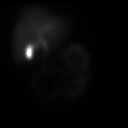
[frame 36/60]
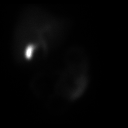
[frame 46/60]
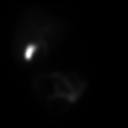
[frame 56/60]
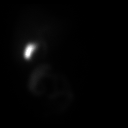

[Series 2: gbef · 3.25mm/px · 6 of 60 frames shown]
[frame 6/60]
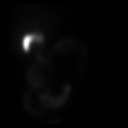
[frame 16/60]
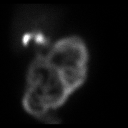
[frame 26/60]
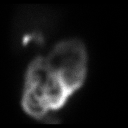
[frame 36/60]
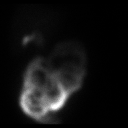
[frame 46/60]
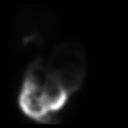
[frame 56/60]
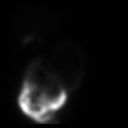

[12 of 12 positions shown; findings below may reference images not displayed]

FINDINGS: Normal tracer extraction from bloodstream indicating normal
hepatocellular function.

Normal excretion of tracer into biliary tree.

Gallbladder visualized at 17 min.

Small bowel visualized at 9 min.

No hepatic retention of tracer.

Subjectively normal emptying of tracer from gallbladder following
fatty meal stimulation.

Calculated gallbladder ejection fraction is 99%, normal.

Patient reported no symptoms following Ensure ingestion.

Normal gallbladder ejection fraction following Ensure ingestion is
greater than 33% at 1 hour.
IMPRESSION: Patent biliary tree with normal gallbladder ejection fraction of 99%
following fatty meal stimulation.

## 2020-12-08 ENCOUNTER — Telehealth: Payer: 59 | Admitting: Physician Assistant

## 2020-12-08 ENCOUNTER — Encounter: Payer: Self-pay | Admitting: Physician Assistant

## 2020-12-08 DIAGNOSIS — U071 COVID-19: Secondary | ICD-10-CM

## 2020-12-08 MED ORDER — MOLNUPIRAVIR EUA 200MG CAPSULE: 4.0000 | ORAL_CAPSULE | Freq: Two times a day (BID) | ORAL | 0 refills | Status: AC

## 2020-12-08 NOTE — Patient Instructions (Addendum)
Hello Pam Graham,  You are being placed in the home monitoring program for COVID-19 (commonly known as Coronavirus).  This is because you are suspected to have the virus or are known to have the virus.  If you are unsure which group you fall into call your clinic.    As part of this program, you'll answer a daily questionnaire in the MyChart mobile app. You'll receive a notification through the MyChart app when the questionnaire is available. When you log in to MyChart, you'll see the tasks in your To Do activity.       Clinicians will see any answers that are concerning and take appropriate steps.  If at any point you are having a medical emergency, call 911.  If otherwise concerned call your clinic instead of coming into the clinic or hospital.  To keep from spreading the disease you should: Stay home and limit contact with other people as much as possible.  Wash your hands frequently. Cover your coughs and sneezes with a tissue, and throw used tissues in the trash.   Clean and disinfect frequently touched surfaces and objects.    Take care of yourself by: Staying home Resting Drinking fluids Take fever-reducing medications (Tylenol/Acetaminophen and Ibuprofen)  For more information on the disease go to the Centers for Disease Control and Prevention website     You are being prescribed MOLNUPIRAVIR for COVID-19 infection.   Please pick up your prescription at: [place] called into [address] and [place]   Please call the pharmacy or go through the drive through vs going inside if you are picking up the mediation yourself to prevent further spread. If prescribed to a [facility] affiliated pharmacy, a pharmacist will bring the medication out to your car.   ADMINISTRATION INSTRUCTIONS: Take with or without food. Swallow the tablets whole. Don't chew, crush, or break the medications because it might not work as well  For each dose of the medication, you should be taking  FOUR tablets at one time, TWICE a day   Finish your full five-day course of Molnupiravir even if you feel better before you're done. Stopping this medication too early can make it less effective to prevent severe illness related to [place].    Molnupiravir is prescribed for YOU ONLY. Don't share it with others, even if they have similar symptoms as you. This medication might not be right for everyone.   Make sure to take steps to protect yourself and others while you're taking this medication in order to get well soon and to prevent others from getting sick with COVID-19.   **If you are of childbearing potential (any gender) - it is advised to not get pregnant while taking this medication and recommended that condoms are used for female partners the next 3 months after taking the medication out of extreme caution    COMMON SIDE EFFECTS: Diarrhea Nausea  Dizziness    If your COVID-19 symptoms get worse, get medical help right away. Call 911 if you experience symptoms such as worsening cough, trouble breathing, chest pain that doesn't go away, confusion, a hard time staying awake, and pale or blue-colored skin. This medication won't prevent all COVID-19 cases from getting worse.   Can take to lessen severity: Vit C 500mg  twice daily Quercertin 250-500mg  twice daily Zinc 75-100mg  daily Melatonin 3-6 mg at bedtime Vit D3 1000-2000 IU daily Aspirin 81 mg daily with food Optional: Famotidine 20mg  daily Also can add tylenol/ibuprofen as needed for fevers and body  aches May add Mucinex or Mucinex DM as needed for cough/congestion  10 Things You Can Do to Manage Your COVID-19 Symptoms at Home If you have possible or confirmed COVID-19 Stay home except to get medical care. Monitor your symptoms carefully. If your symptoms get worse, call your healthcare provider immediately. Get rest and stay hydrated. If you have a medical appointment, call the healthcare provider ahead of time and tell  them that you have or may have COVID-19. For medical emergencies, call 911 and notify the dispatch personnel that you have or may have COVID-19. Cover your cough and sneezes with a tissue or use the inside of your elbow. Wash your hands often with soap and water for at least 20 seconds or clean your hands with an alcohol-based hand sanitizer that contains at least 60% alcohol. As much as possible, stay in a specific room and away from other people in your home. Also, you should use a separate bathroom, if available. If you need to be around other people in or outside of the home, wear a mask. Avoid sharing personal items with other people in your household, like dishes, towels, and bedding. Clean all surfaces that are touched often, like counters, tabletops, and doorknobs. Use household cleaning sprays or wipes according to the label instructions. [email or web address] 01/08/2020 This information is not intended to replace advice given to you by your health care provider. Make sure you discuss any questions you have with your healthcare provider. Document Revised: 07/29/2020 Document Reviewed: 07/29/2020 Elsevier Patient Education  2022 [place]: What to Do if You Are Sick CDC has updated isolation and quarantine recommendations for the public, and is revising the CDC website to reflect these changes. These recommendations do not apply to healthcare personnel and do not supersede state, local, tribal, or territorial laws, rules, andregulations. If you have a fever, cough or other symptoms, you might have COVID-19. Most people have mild illness and are able to recover at home. If you are sick: Keep track of your symptoms. If you have an emergency warning sign (including trouble breathing), call 911. Steps to help prevent the spread of COVID-19 if you are sick If you are sick with COVID-19 or think you might have COVID-19, follow the steps below to care for yourself and to help  protect other peoplein your home and community. Stay home except to get medical care Stay home. Most people with COVID-19 have mild illness and can recover at home without medical care. Do not leave your home, except to get medical care. Do not visit public areas. Take care of yourself. Get rest and stay hydrated. Take over-the-counter medicines, such as acetaminophen, to help you feel better. Stay in touch with your doctor. Call before you get medical care. Be sure to get care if you have trouble breathing, or have any other emergency warning signs, or if you think it is an emergency. Avoid public transportation, ride-sharing, or taxis. Separate yourself from other people As much as possible, stay in a specific room and away from other people and pets in your home. If possible, you should use a separate bathroom. If you need to be around other people or animals in oroutside of the home, wear a mask. Tell your close contactsthat they may have been exposed to COVID-19. An infected person can spread COVID-19 starting 48 hours (or 2 days) before the person has any symptoms or tests positive. By letting your close contacts know they may have been exposed to  COVID-19, you are helping to protect everyone. Additional guidance is available for those living in close quarters and shared housing. See COVID-19 and Animals if you have questions about pets. If you are diagnosed with COVID-19, someone from the health department may call you. Answer the call to slow the spread. Monitor your symptoms Symptoms of COVID-19 include fever, cough, or other symptoms. Follow care instructions from your healthcare provider and local health department. Your local health authorities may give instructions on checking your symptoms and reporting information. When to seek emergency medical attention Look for emergency warning signs* for COVID-19. If someone is showing any of these signs, seek emergency medical care  immediately: Trouble breathing Persistent pain or pressure in the chest New confusion Inability to wake or stay awake Pale, gray, or blue-colored skin, lips, or nail beds, depending on skin tone *This list is not all possible symptoms. Please call your medical provider forany other symptoms that are severe or concerning to you. Call 911 or call ahead to your local emergency facility: Notify the operator that you are seeking care for someone who has or may haveCOVID-19. Call ahead before visiting your doctor Call ahead. Many medical visits for routine care are being postponed or done by phone or telemedicine. If you have a medical appointment that cannot be postponed, call your doctor's office, and tell them you have or may have COVID-19. This will help the office protect themselves and other patients. Get tested If you have symptoms of COVID-19, get tested. While waiting for test results, you stay away from others, including staying apart from those living in your household. Self-tests are one of several options for testing for the virus that causes COVID-19 and may be more convenient than laboratory-based tests and point-of-care tests. Ask your healthcare provider or your local health department if you need help interpreting your test results. You can visit your state, tribal, local, and territorial health department's website to look for the latest local information on testing sites. If you are sick, wear a mask over your nose and mouth You should wear a mask over your nose and mouth if you must be around other people or animals, including pets (even at home). You don't need to wear the mask if you are alone. If you can't put on a mask (because of trouble breathing, for example), cover your coughs and sneezes in some other way. Try to stay at least 6 feet away from other people. This will help protect the people around you. Masks should not be placed on young children under age 60 years, anyone  who has trouble breathing, or anyone who is not able to remove the mask without help. Note: During the COVID-19 pandemic, medical grade facemasks are reserved forhealthcare workers and some first responders. Cover your coughs and sneezes Cover your mouth and nose with a tissue when you cough or sneeze. Throw away used tissues in a lined trash can. Immediately wash your hands with soap and water for at least 20 seconds. If soap and water are not available, clean your hands with an alcohol-based hand sanitizer that contains at least 60% alcohol. Clean your hands often Wash your hands often with soap and water for at least 20 seconds. This is especially important after blowing your nose, coughing, or sneezing; going to the bathroom; and before eating or preparing food. Use hand sanitizer if soap and water are not available. Use an alcohol-based hand sanitizer with at least 60% alcohol, covering all surfaces of your hands  and rubbing them together until they feel dry. Soap and water are the best option, especially if hands are visibly dirty. Avoid touching your eyes, nose, and mouth with unwashed hands. Handwashing Tips Avoid sharing personal household items Do not share dishes, drinking glasses, cups, eating utensils, towels, or bedding with other people in your home. Wash these items thoroughly after using them with soap and water or put in the dishwasher. Clean all "high-touch" surfaces every day Clean and disinfect high-touch surfaces in your "sick room" and bathroom; wear disposable gloves. Let someone else clean and disinfect surfaces in common areas, but you should clean your bedroom and bathroom, if possible. If a caregiver or other person needs to clean and disinfect a sick person's bedroom or bathroom, they should do so on an as-needed basis. The caregiver/other person should wear a mask and disposable gloves prior to cleaning. They should wait as long as possible after the person who is sick  has used the bathroom before coming in to clean and use the bathroom. High-touch surfaces include phones, remote controls, counters, tabletops, doorknobs, bathroom fixtures, toilets, keyboards, tablets, and bedside tables. Clean and disinfect areas that may have blood, stool, or body fluids on them. Use household cleaners and disinfectants. Clean the area or item with soap and water or another detergent if it is dirty. Then, use a household disinfectant. Be sure to follow the instructions on the label to ensure safe and effective use of the product. Many products recommend keeping the surface wet for several minutes to ensure germs are killed. Many also recommend precautions such as wearing gloves and making sure you have good ventilation during use of the product. Use a product from [employer] List N: Disinfectants for Coronavirus (UPJSR-15). Complete Disinfection Guidance When you can be around others after being sick with COVID-19 Deciding when you can be around others is different for different situations. Find out when you can safely end home isolation. For any additional questions about your care,contact your healthcare provider or state or local health department. 06/01/2020 Content source: [facility] ([facility]), Division of Viral Diseases This information is not intended to replace advice given to you by your health care provider. Make sure you discuss any questions you have with your healthcare provider. Document Revised: 07/29/2020 Document Reviewed: 07/29/2020 Elsevier Patient Education  [place].

## 2020-12-08 NOTE — Progress Notes (Signed)
[name], Pam Graham are scheduled for a virtual visit with your provider today.    Just as we do with appointments in the office, we must obtain your consent to participate.  Your consent will be active for this visit and any virtual visit you may have with one of our providers in the next 365 days.    If you have a MyChart account, I can also send a copy of this consent to you electronically.  All virtual visits are billed to your insurance company just like a traditional visit in the office.  As this is a virtual visit, video technology does not allow for your provider to perform a traditional examination.  This may limit your provider's ability to fully assess your condition.  If your provider identifies any concerns that need to be evaluated in person or the need to arrange testing such as labs, EKG, etc, we will make arrangements to do so.    Although advances in technology are sophisticated, we cannot ensure that it will always work on either your end or our end.  If the connection with a video visit is poor, we may have to switch to a telephone visit.  With either a video or telephone visit, we are not always able to ensure that we have a secure connection.   I need to obtain your verbal consent now.   Are you willing to proceed with your visit today?   Pam Graham has provided verbal consent on 12/08/2020 for a virtual visit (video or telephone).   Mar Daring, PA-C 12/08/2020  8:42 AM    MyChart Video Visit    Virtual Visit via Video Note   This visit type was conducted due to national recommendations for restrictions regarding the COVID-19 Pandemic (e.g. social distancing) in an effort to limit this patient's exposure and mitigate transmission in our community. This patient is at least at moderate risk for complications without adequate follow up. This format is felt to be most appropriate for this patient at this time. Physical exam was limited by quality of the video and audio  technology used for the visit.   Patient location: Home Provider location: Home office in [place]  I discussed the limitations of evaluation and management by telemedicine and the availability of in person appointments. The patient expressed understanding and agreed to proceed.  [name]   DOB: Dec 18, 1970   50 y.o. Female  MRN: [id number] Visit Date: 12/08/2020  Today's healthcare provider: Mar Daring, PA-C   No chief complaint on file.  Subjective    URI  This is a new problem. The current episode started yesterday (just got back from [place] on Sunday morning). The problem has been unchanged. Associated symptoms include congestion, coughing (dry), headaches, rhinorrhea, sinus pain and sneezing. Pertinent negatives include no abdominal pain, ear pain, nausea, plugged ear sensation, sore throat, vomiting or wheezing.   Covid 19 positive x 2 on rapid home test, patient showed provider in video She is vaccinated and boosted x 1 for Covid 19.  Patient Active Problem List   Diagnosis Date Noted   MDD (major depressive disorder), recurrent severe, without psychosis ([place]) 11/02/2015   SYNCOPE 02/15/2010   POLYCYSTIC OVARIAN DISEASE 02/14/2010   GRAND MAL SEIZURE 02/14/2010   PAC 02/14/2010   PREMATURE VENTRICULAR CONTRACTIONS 02/14/2010   OTHER PSORIASIS AND SIMILAR DISORDERS 02/14/2010   DEAFNESS, CONGENITAL 02/14/2010   HEART MURMUR, BENIGN 02/14/2010   Past Medical History:  Diagnosis Date  Anal fissure    Anxiety    Deafness in right ear    congenital   Depression    Erythrodermic psoriasis    Fatty liver 12/26/11   Hypertension    IBS (irritable bowel syndrome)    Ovarian cyst    PAC (premature atrial contraction) 04/02/08   PCOS (polycystic ovarian syndrome)    PVC (premature ventricular contraction) 04/02/08   Seizure disorder (HCC)    Tubular adenoma of colon       Medications: Outpatient Medications Prior to Visit  Medication Sig    ALPRAZolam (XANAX) 0.5 MG tablet Take 0.5-1 tablets (0.25-0.5 mg total) by mouth 3 (three) times daily as needed for anxiety.   cetirizine (ZYRTEC) 10 MG tablet Take 10 mg by mouth daily.   cholecalciferol (VITAMIN D3) 25 MCG (1000 UT) tablet Take 5,000 Units by mouth daily.   glycopyrrolate (ROBINUL-FORTE) 2 MG tablet Take 1 tablet (2 mg total) by mouth 2 (two) times daily. (Patient not taking: No sig reported)   levETIRAcetam (KEPPRA XR) 500 MG 24 hr tablet Take 2 tablets (1,000 mg total) by mouth at bedtime. (Patient not taking: No sig reported)   metFORMIN (GLUCOPHAGE) 500 MG tablet TK 2 TS PO D   norethindrone-ethinyl estradiol-iron (MICROGESTIN FE,GILDESS FE,LOESTRIN FE) 1.5-30 MG-MCG tablet Take 1 tablet by mouth at bedtime.   sertraline (ZOLOFT) 100 MG tablet Take 1 tablet (100 mg total) by mouth daily.   topiramate (TOPAMAX) 50 MG tablet Take by mouth.   No facility-administered medications prior to visit.    Review of Systems  Constitutional:  Positive for chills. Negative for fever.  HENT:  Positive for congestion, rhinorrhea, sinus pain and sneezing. Negative for ear pain and sore throat.   Respiratory:  Positive for cough (dry). Negative for chest tightness, shortness of breath and wheezing.   Cardiovascular: Negative.   Gastrointestinal:  Negative for abdominal pain, nausea and vomiting.  Neurological:  Positive for headaches. Negative for dizziness.   Last CBC Lab Results  Component Value Date   WBC 5.3 11/01/2015   HGB 14.8 11/01/2015   HCT 43.4 11/01/2015   MCV 92.9 11/01/2015   MCH 31.7 11/01/2015   RDW 12.5 11/01/2015   PLT 239 97/07/6376   Last metabolic panel Lab Results  Component Value Date   GLUCOSE 108 (H) 11/01/2015   NA 140 11/01/2015   K 3.5 11/01/2015   CL 103 11/01/2015   CO2 26 11/01/2015   BUN 8 11/01/2015   CREATININE 0.83 11/01/2015   GFRNONAA >60 11/01/2015   GFRAA >60 11/01/2015   CALCIUM 9.1 11/01/2015   PROT 7.5 11/01/2015   ALBUMIN  4.3 11/01/2015   BILITOT 0.9 11/01/2015   ALKPHOS 72 11/01/2015   AST 27 11/01/2015   ALT 19 11/01/2015   ANIONGAP 11 11/01/2015      Objective    There were no vitals taken for this visit. BP Readings from Last 3 Encounters:  11/10/15 132/74  11/01/15 140/93  08/08/15 108/66   Wt Readings from Last 3 Encounters:  11/10/15 169 lb (76.7 kg)  11/01/15 180 lb (81.6 kg)  07/22/15 191 lb (86.6 kg)      Physical Exam Vitals reviewed.  Constitutional:      General: She is not in acute distress.    Appearance: Normal appearance. She is well-developed. She is not ill-appearing.  HENT:     Head: Normocephalic and atraumatic.  Pulmonary:     Effort: Pulmonary effort is normal. No respiratory distress (able to  speak in full, coherent sentences).  Musculoskeletal:     Cervical back: Normal range of motion and neck supple.  Neurological:     Mental Status: She is alert.  Psychiatric:        Mood and Affect: Mood normal.        Behavior: Behavior normal.        Thought Content: Thought content normal.        Judgment: Judgment normal.       Assessment & Plan     1. COVID-19 - Continue OTC symptomatic management of choice - Will send OTC vitamins and supplement information through AVS - Molnupiravir prescribed for patient; Risks for birth defects discussed, patient voices understanding and agrees with using alternate protection during treatment and up to 4 days after - Patient enrolled in MyChart symptom monitoring - Push fluids - Rest as needed - Discussed return precautions and when to seek in-person evaluation, sent via AVS as well - molnupiravir EUA 200 mg CAPS; Take 4 capsules (800 mg total) by mouth 2 (two) times daily for 5 days.  Dispense: 40 capsule; Refill: 0   No follow-ups on file.     I discussed the assessment and treatment plan with the patient. The patient was provided an opportunity to ask questions and all were answered. The patient agreed with the plan  and demonstrated an understanding of the instructions.   The patient was advised to call back or seek an in-person evaluation if the symptoms worsen or if the condition fails to improve as anticipated.  I provided 17 minutes of face-to-face time during this encounter via MyChart Video enabled encounter.   [place] [phone] (phone) [phone] (fax)  [place]

## 2020-12-16 ENCOUNTER — Encounter (HOSPITAL_COMMUNITY): Payer: Self-pay | Admitting: Orthopedic Surgery

## 2020-12-16 ENCOUNTER — Encounter: Payer: Self-pay | Admitting: Physician Assistant

## 2020-12-19 ENCOUNTER — Encounter (HOSPITAL_COMMUNITY): Payer: Self-pay | Admitting: Orthopedic Surgery

## 2020-12-19 ENCOUNTER — Other Ambulatory Visit: Payer: Self-pay

## 2020-12-19 NOTE — Progress Notes (Signed)
DUE TO COVID-19 ONLY ONE VISITOR IS ALLOWED TO COME WITH YOU AND STAY IN THE WAITING ROOM ONLY DURING PRE OP AND PROCEDURE DAY OF SURGERY.   PCP - Collene Mares, PA, [facility]  Cardiologist - n/a  Chest x-ray - n/a EKG - n/a Stress Test - n/a ECHO - 02/28/10 Cardiac Cath - n/a  Sleep Study -  n/a CPAP - none  ERAS: Clear liquids til 8 am DOS  Anesthesia review: Yes  STOP now taking any Aspirin (unless otherwise instructed by your surgeon), Aleve, Naproxen, Ibuprofen, Motrin, Advil, Goody's, BC's, all herbal medications, fish oil, and all vitamins.   Coronavirus Screening Covid test n/a - Ambulatory Surgery  Do you have any of the following symptoms:  Cough yes/no: No Fever (>100.14F)  yes/no: No Runny nose yes/no: No Sore throat yes/no: No Difficulty breathing/shortness of breath  yes/no: No  Have you traveled in the last 14 days and where? Yes [place] 11/19/20-12/04/20  Patient verbalized understanding of instructions that were given via phone.

## 2020-12-20 ENCOUNTER — Encounter (HOSPITAL_COMMUNITY)
Admission: RE | Disposition: A | Discharge status: Home / Self Care | Payer: Self-pay | Source: Home / Self Care | Attending: Orthopedic Surgery

## 2020-12-20 ENCOUNTER — Ambulatory Visit: Payer: 59 | Admitting: Psychiatry

## 2020-12-20 ENCOUNTER — Ambulatory Visit (HOSPITAL_COMMUNITY): Payer: 59 | Admitting: Emergency Medicine

## 2020-12-20 ENCOUNTER — Ambulatory Visit (HOSPITAL_COMMUNITY)
Admission: RE | Admit: 2020-12-20 | Discharge: 2020-12-20 | Disposition: A | Discharge status: Home / Self Care | Payer: 59 | Attending: Orthopedic Surgery | Admitting: Orthopedic Surgery

## 2020-12-20 ENCOUNTER — Encounter (HOSPITAL_COMMUNITY): Payer: Self-pay | Admitting: Orthopedic Surgery

## 2020-12-20 DIAGNOSIS — Z8669 Personal history of other diseases of the nervous system and sense organs: Secondary | ICD-10-CM | POA: Insufficient documentation

## 2020-12-20 DIAGNOSIS — Z86010 Personal history of colonic polyps: Secondary | ICD-10-CM | POA: Diagnosis not present

## 2020-12-20 DIAGNOSIS — M65841 Other synovitis and tenosynovitis, right hand: Secondary | ICD-10-CM | POA: Diagnosis present

## 2020-12-20 HISTORY — DX: Cardiac arrhythmia, unspecified: I49.9

## 2020-12-20 HISTORY — DX: Other specified postprocedural states: Z98.890

## 2020-12-20 HISTORY — PX: TRIGGER FINGER RELEASE: SHX641

## 2020-12-20 HISTORY — DX: Nausea with vomiting, unspecified: R11.2

## 2020-12-20 HISTORY — DX: Hypothyroidism, unspecified: E03.9

## 2020-12-20 LAB — COMPREHENSIVE METABOLIC PANEL
ALT: 24 U/L (ref 0–44)
AST: 31 U/L (ref 15–41)
Albumin: 3.5 g/dL (ref 3.5–5.0)
Alkaline Phosphatase: 70 U/L (ref 38–126)
Anion gap: 9 (ref 5–15)
BUN: 10 mg/dL (ref 6–20)
CO2: 25 mmol/L (ref 22–32)
Calcium: 8.9 mg/dL (ref 8.9–10.3)
Chloride: 102 mmol/L (ref 98–111)
Creatinine, Ser: 0.79 mg/dL (ref 0.44–1.00)
GFR, Estimated: >60 mL/min (ref >60)
Glucose, Bld: 101 mg/dL — ABNORMAL HIGH (ref 70–99)
Potassium: 4.5 mmol/L (ref 3.5–5.1)
Sodium: 136 mmol/L (ref 135–145)
Total Bilirubin: 0.8 mg/dL (ref 0.3–1.2)
Total Protein: 6.6 g/dL (ref 6.5–8.1)

## 2020-12-20 LAB — POCT PREGNANCY, URINE: Preg Test, Ur: NEGATIVE

## 2020-12-20 SURGERY — RELEASE, A1 PULLEY, FOR TRIGGER FINGER
Anesthesia: General | Site: Finger | Laterality: Right

## 2020-12-20 MED ORDER — CHLORHEXIDINE GLUCONATE 0.12 % MT SOLN
OROMUCOSAL | Status: AC
  Administered 2020-12-20: 15 mL via OROMUCOSAL
  Filled 2020-12-20: qty 15

## 2020-12-20 MED ORDER — SCOPOLAMINE 1 MG/3DAYS TD PT72
MEDICATED_PATCH | TRANSDERMAL | Status: AC
  Administered 2020-12-20: 1.5 mg via TRANSDERMAL
  Filled 2020-12-20: qty 1

## 2020-12-20 MED ORDER — PROMETHAZINE HCL 25 MG/ML IJ SOLN: 6.2500 mg | INTRAMUSCULAR | Status: DC | PRN

## 2020-12-20 MED ORDER — TRAMADOL HCL 50 MG PO TABS: 50.0000 mg | ORAL_TABLET | Freq: Four times a day (QID) | ORAL | 0 refills | Status: AC | PRN

## 2020-12-20 MED ORDER — BUPIVACAINE HCL (PF) 0.25 % IJ SOLN
INTRAMUSCULAR | Status: AC
  Filled 2020-12-20: qty 30

## 2020-12-20 MED ORDER — SCOPOLAMINE 1 MG/3DAYS TD PT72: 1.0000 | MEDICATED_PATCH | TRANSDERMAL | Status: DC

## 2020-12-20 MED ORDER — MIDAZOLAM HCL 2 MG/2ML IJ SOLN
INTRAMUSCULAR | Status: AC
  Filled 2020-12-20: qty 2

## 2020-12-20 MED ORDER — ONDANSETRON HCL 4 MG/2ML IJ SOLN
INTRAMUSCULAR | Status: DC | PRN
  Administered 2020-12-20: 4 mg via INTRAVENOUS

## 2020-12-20 MED ORDER — ORAL CARE MOUTH RINSE: 15.0000 mL | Freq: Once | OROMUCOSAL | Status: AC

## 2020-12-20 MED ORDER — LIDOCAINE 2% (20 MG/ML) 5 ML SYRINGE
INTRAMUSCULAR | Status: DC | PRN
  Administered 2020-12-20: 10 mg via INTRAVENOUS

## 2020-12-20 MED ORDER — VANCOMYCIN HCL IN DEXTROSE 1-5 GM/200ML-% IV SOLN
1000.0000 mg | INTRAVENOUS | Status: AC
  Administered 2020-12-20: 1000 mg via INTRAVENOUS
  Filled 2020-12-20: qty 200

## 2020-12-20 MED ORDER — CHLORHEXIDINE GLUCONATE 0.12 % MT SOLN: 15.0000 mL | Freq: Once | OROMUCOSAL | Status: AC

## 2020-12-20 MED ORDER — OXYCODONE HCL 5 MG/5ML PO SOLN: 5.0000 mg | Freq: Once | ORAL | Status: DC | PRN

## 2020-12-20 MED ORDER — FENTANYL CITRATE (PF) 100 MCG/2ML IJ SOLN: 25.0000 ug | INTRAMUSCULAR | Status: DC | PRN

## 2020-12-20 MED ORDER — OXYCODONE HCL 5 MG PO TABS
5.0000 mg | ORAL_TABLET | Freq: Once | ORAL | Status: DC | PRN
Start: 2020-12-20 — End: 2020-12-20

## 2020-12-20 MED ORDER — MEPERIDINE HCL 25 MG/ML IJ SOLN: 6.2500 mg | INTRAMUSCULAR | Status: DC | PRN

## 2020-12-20 MED ORDER — FENTANYL CITRATE (PF) 250 MCG/5ML IJ SOLN
INTRAMUSCULAR | Status: AC
  Filled 2020-12-20: qty 5

## 2020-12-20 MED ORDER — LACTATED RINGERS IV SOLN: INTRAVENOUS | Status: DC

## 2020-12-20 MED ORDER — DEXAMETHASONE SODIUM PHOSPHATE 10 MG/ML IJ SOLN
INTRAMUSCULAR | Status: DC | PRN
  Administered 2020-12-20: 10 mg via INTRAVENOUS

## 2020-12-20 MED ORDER — MIDAZOLAM HCL 5 MG/5ML IJ SOLN
INTRAMUSCULAR | Status: DC | PRN
  Administered 2020-12-20: 2 mg via INTRAVENOUS

## 2020-12-20 MED ORDER — PROPOFOL 10 MG/ML IV BOLUS
INTRAVENOUS | Status: DC | PRN
  Administered 2020-12-20: 30 mg via INTRAVENOUS
  Administered 2020-12-20: 20 mg via INTRAVENOUS

## 2020-12-20 MED ORDER — BUPIVACAINE HCL (PF) 0.25 % IJ SOLN
INTRAMUSCULAR | Status: DC | PRN
  Administered 2020-12-20: 5 mL

## 2020-12-20 MED ORDER — LIDOCAINE HCL (PF) 0.5 % IJ SOLN
INTRAMUSCULAR | Status: DC | PRN
  Administered 2020-12-20: 40 mL via INTRAVENOUS

## 2020-12-20 SURGICAL SUPPLY — 34 items
APL PRP STRL LF DISP 70% ISPRP (MISCELLANEOUS) ×1
BLADE SURG 15 STRL LF DISP TIS (BLADE) ×1 IMPLANT
BLADE SURG 15 STRL SS (BLADE) ×2
BNDG CMPR 9X4 STRL LF SNTH (GAUZE/BANDAGES/DRESSINGS) ×1
BNDG COHESIVE 2X5 TAN STRL LF (GAUZE/BANDAGES/DRESSINGS) ×2 IMPLANT
BNDG ESMARK 4X9 LF (GAUZE/BANDAGES/DRESSINGS) ×1 IMPLANT
CHLORAPREP W/TINT 26 (MISCELLANEOUS) ×2 IMPLANT
CORD BIPOLAR FORCEPS 12FT (ELECTRODE) ×1 IMPLANT
COVER BACK TABLE 60X90IN (DRAPES) ×2 IMPLANT
COVER MAYO STAND STRL (DRAPES) ×3 IMPLANT
CUFF TOURN SGL QUICK 18X4 (TOURNIQUET CUFF) ×1 IMPLANT
DECANTER SPIKE VIAL GLASS SM (MISCELLANEOUS) IMPLANT
DRAPE EXTREMITY T 121X128X90 (DISPOSABLE) ×2 IMPLANT
DRAPE SURG 17X23 STRL (DRAPES) ×2 IMPLANT
GAUZE SPONGE 4X4 12PLY STRL (GAUZE/BANDAGES/DRESSINGS) ×2 IMPLANT
GAUZE XEROFORM 1X8 LF (GAUZE/BANDAGES/DRESSINGS) ×2 IMPLANT
GLOVE SURG ENC MOIS LTX SZ6.5 (GLOVE) ×1 IMPLANT
GLOVE SURG ORTHO LTX SZ8 (GLOVE) ×2 IMPLANT
GLOVE SURG UNDER LTX SZ7 (GLOVE) ×1 IMPLANT
GLOVE SURG UNDER LTX SZ7.5 (GLOVE) ×1 IMPLANT
GLOVE SURG UNDER POLY LF SZ8.5 (GLOVE) ×2 IMPLANT
GOWN STRL REUS W/ TWL LRG LVL3 (GOWN DISPOSABLE) ×1 IMPLANT
GOWN STRL REUS W/TWL LRG LVL3 (GOWN DISPOSABLE) ×2
GOWN STRL REUS W/TWL XL LVL3 (GOWN DISPOSABLE) ×2 IMPLANT
KIT BASIN OR (CUSTOM PROCEDURE TRAY) ×1 IMPLANT
NDL PRECISIONGLIDE 27X1.5 (NEEDLE) ×1 IMPLANT
NEEDLE PRECISIONGLIDE 27X1.5 (NEEDLE) ×2 IMPLANT
NS IRRIG 1000ML POUR BTL (IV SOLUTION) ×2 IMPLANT
STOCKINETTE 4X48 STRL (DRAPES) ×2 IMPLANT
SUT ETHILON 4 0 PS 2 18 (SUTURE) ×2 IMPLANT
SYR BULB EAR ULCER 3OZ GRN STR (SYRINGE) ×2 IMPLANT
SYR CONTROL 10ML LL (SYRINGE) ×2 IMPLANT
TOWEL GREEN STERILE FF (TOWEL DISPOSABLE) ×4 IMPLANT
UNDERPAD 30X36 HEAVY ABSORB (UNDERPADS AND DIAPERS) ×2 IMPLANT

## 2020-12-20 NOTE — Brief Op Note (Signed)
12/20/2020  11:38 AM  PATIENT:  Pam Graham  50 y.o. female  PRE-OPERATIVE DIAGNOSIS:  RIGHT INDEX FINGER TRIGGER FINGER  POST-OPERATIVE DIAGNOSIS:  RIGHT INDEX FINGER TRIGGER FINGER  PROCEDURE:  Procedure(s): RELEASE A-1 PULLEY RIGHT INDEX FINGER (Right)  SURGEON:  Surgeon(s) and Role:    Daryll Brod, MD - Primary  PHYSICIAN ASSISTANT:   ASSISTANTS: none   ANESTHESIA:   local, regional, and IV sedation  EBL:  3 mL   BLOOD ADMINISTERED:none  DRAINS: none   LOCAL MEDICATIONS USED:  BUPIVICAINE   SPECIMEN:  No Specimen  DISPOSITION OF SPECIMEN:  N/A  COUNTS:  YES  TOURNIQUET:   Total Tourniquet Time Documented: Forearm (Right) - 19 minutes Total: Forearm (Right) - 19 minutes   DICTATION: .Viviann Spare Dictation  PLAN OF CARE: Discharge to home after PACU  PATIENT DISPOSITION:  PACU - hemodynamically stable.

## 2020-12-20 NOTE — Anesthesia Postprocedure Evaluation (Signed)
Anesthesia Post Note  [name]  Procedure(s) Performed: RELEASE A-1 PULLEY RIGHT INDEX FINGER (Right: Finger)     Patient location during evaluation: PACU Anesthesia Type: Bier Block Level of consciousness: patient cooperative and awake and alert Pain management: pain level controlled Vital Signs Assessment: post-procedure vital signs reviewed and stable Respiratory status: spontaneous breathing Cardiovascular status: stable Anesthetic complications: no   No notable events documented.  Last Vitals:  Vitals:   12/20/20 1145 12/20/20 1200  BP: 135/88 136/81  Pulse: 65 77  Resp: 13 19  Temp:    SpO2: 100% 99%    Last Pain:  Vitals:   12/20/20 1200  TempSrc:   PainSc: 0-No pain                 Nolon Nations

## 2020-12-20 NOTE — Transfer of Care (Signed)
Immediate Anesthesia Transfer of Care Note  Patient: Lenzie Montesano  Procedure(s) Performed: RELEASE A-1 PULLEY RIGHT INDEX FINGER (Right: Finger)  Patient Location: PACU  Anesthesia Type:Bier block  Level of Consciousness: awake, alert  and oriented  Airway & Oxygen Therapy: Patient Spontanous Breathing  Post-op Assessment: Report given to RN and Post -op Vital signs reviewed and stable  Post vital signs: Reviewed and stable  Last Vitals:  Vitals Value Taken Time  BP 141/88 12/20/20 1137  Temp    Pulse 69 12/20/20 1139  Resp 16 12/20/20 1139  SpO2 100 % 12/20/20 1139  Vitals shown include unvalidated device data.  Last Pain:  Vitals:   12/20/20 0858  TempSrc:   PainSc: 0-No pain      Patients Stated Pain Goal: 0 (36/06/77 0340)  Complications: No notable events documented.

## 2020-12-20 NOTE — Op Note (Signed)
NAME: Pam Graham RECORD NO: [id number] DATE OF BIRTH: [birth year] FACILITY: [facility] LOCATION: [facility] OR PHYSICIAN: Wynonia Sours, MD   OPERATIVE REPORT   DATE OF PROCEDURE: 12/20/20    PREOPERATIVE DIAGNOSIS: Stenosing tenosynovitis right index finger   POSTOPERATIVE DIAGNOSIS: Same   PROCEDURE: Release A1 pulley right index   SURGEON: Daryll Brod, M.D.   ASSISTANT: none   ANESTHESIA:  Bier block with sedation and Local   INTRAVENOUS FLUIDS:  Per anesthesia flow sheet.   ESTIMATED BLOOD LOSS:  Minimal.   COMPLICATIONS:  None.   SPECIMENS:  none   TOURNIQUET TIME:    Total Tourniquet Time Documented: Forearm (Right) - 19 minutes Total: Forearm (Right) - 19 minutes    DISPOSITION:  Stable to PACU.   INDICATIONS: Patient is a 50 year old female with a history of triggering of her right index finger.  This not responded to conservative treatment including Medrol Dosepak and 2 injections.  She is admitted for release A1 pulley right index finger.  Pre-.  Postoperative course been discussed along with risks and complications.  She is aware that there is no guarantee to the surgery the possibility of infection recurrence injury to arteries nerves tendons incomplete relief of symptoms and dystrophy.  Preoperative area the patient is seen the extremity marked by both patient and surgeon antibiotic given  OPERATIVE COURSE: Patient is placed in the supine position with the right arm free.  A forearm IV regional anesthetic was carried out without difficulty under the direction the anesthesia department.  She was prepped with ChloraPrep.  A 3-minute dry time was allowed timeout taken to confirm patient procedure.  After adequate anesthesia was afforded, a oblique incision was made over the A1 pulley of the right index finger.  This carried down through subcutaneous tissue.  The neurovascular bundles were identified protected radially and ulnarly.  The A1 pulley was then released  on its radial aspect a small incision made centrally and A2.  The 2 tendons were then separated with 2 retractors and adhesions between the 2 ruptured.  Finger was late placed through a full passive range of motion no further triggering was noted.  The wound was copiously irrigated with saline.  The skin was closed interrupted 4-0 nylon sutures.  Local infiltration quarter percent bupivacaine without epinephrine was given approximately 5 cc was used.  A sterile compressive dressing with the fingers free was applied.  Deflation of the tourniquet all fingers immediately pink.  She was taken to the recovery room for observation in satisfactory condition.  She will be discharged home to return to the hand center [facility] in 1 week on Tylenol ibuprofen for pain with Ultram for breakthrough.   Daryll Brod, MD Electronically signed, 12/20/20

## 2020-12-20 NOTE — H&P (Signed)
  Pam Graham is an 50 y.o. female.   Chief Complaint: catching right index finger HPI: Patient is a 50 yo female with catching of her right index finger. She has undergone two injection and medrol dose pack treatment with only temporary relief.Her has no history of injury.She has no history of diabetes.Family history is positive for RA.  She has no numbness or tingling.  Past Medical History:  Diagnosis Date   Anal fissure    Anxiety    Deafness in right ear    congenital, no hearing aid   Depression    Erythrodermic psoriasis    Fatty liver 12/26/2011   Hypertension    Hypothyroidism    IBS (irritable bowel syndrome)    pt denies this dx on 12/19/20   Ovarian cyst    PAC (premature atrial contraction) 04/02/2008   PCOS (polycystic ovarian syndrome)    PONV (postoperative nausea and vomiting)    PVC (premature ventricular contraction) 04/02/2008   Seizure disorder ([place])    only one in early 30's caused by medication per patient, none since   Tubular adenoma of colon     Past Surgical History:  Procedure Laterality Date   ABDOMINOPLASTY     COLONOSCOPY     CYSTOSCOPY     LIPOSUCTION     WISDOM TOOTH EXTRACTION      Family History  Problem Relation Age of Onset   Colon cancer Paternal Grandmother    Colon polyps Father    Colon polyps Paternal Aunt    Heart disease Maternal Grandfather    Alcoholism Maternal Grandfather    Heart disease Other        uncle   Diabetes Other        aunt   Diabetes Other        uncle   Anesthesia problems Neg Hx    Social History:  reports that she has never smoked. She has never used smokeless tobacco. She reports previous alcohol use. She reports that she does not use drugs.  Allergies:    No medications prior to admission.    No results found for this or any previous visit (from the past 48 hour(s)).  No results found.   Pertinent items are noted in HPI.  Height 5\' 7"  (1.702 m), weight 83.9 kg.  General  appearance: alert, cooperative, and appears stated age Head: Normocephalic, without obvious abnormality, atraumatic Neck: no JVD Resp: clear to auscultation bilaterally Cardio: regular rate and rhythm, S1, S2 normal, no murmur, click, rub or gallop GI: soft, non-tender; bowel sounds normal; no masses,  no organomegaly Extremities:  Catching right index finger Pulses: 2+ and symmetric Skin: Skin color, texture, turgor normal. No rashes or lesions Neurologic: Grossly normal Incision/Wound: na  Assessment/Plan Diagnosis: trigger right index Planned release A1 pulley right index finger.  Pre-.  Postoperative course been discussed along with risks and complications.  She is aware there is no guarantee to the surgery the possibility of infection recurrence injury to arteries nerves tendons incomplete relief symptoms and dystrophy.  Schedule as an outpatient under regional anesthesia. Daryll Brod 12/20/2020, 5:29 AM

## 2020-12-20 NOTE — Anesthesia Preprocedure Evaluation (Addendum)
Anesthesia Evaluation  Patient identified by MRN, date of birth, ID band Patient awake    Reviewed: Allergy & Precautions, NPO status , Patient's Chart, lab work & pertinent test results  History of Anesthesia Complications (+) PONV and history of anesthetic complications  Airway Mallampati: II  TM Distance: >3 FB Neck ROM: Full    Dental no notable dental hx. (+) Dental Advisory Given, Teeth Intact   Pulmonary neg pulmonary ROS,    Pulmonary exam normal breath sounds clear to auscultation       Cardiovascular hypertension, Pt. on medications Normal cardiovascular exam+ Valvular Problems/Murmurs  Rhythm:Regular Rate:Normal     Neuro/Psych Seizures -,  PSYCHIATRIC DISORDERS Anxiety Depression    GI/Hepatic negative GI ROS, Neg liver ROS,   Endo/Other  Hypothyroidism   Renal/GU negative Renal ROS     Musculoskeletal negative musculoskeletal ROS (+)   Abdominal   Peds  Hematology negative hematology ROS (+)   Anesthesia Other Findings   Reproductive/Obstetrics                            Anesthesia Physical Anesthesia Plan  ASA: 2  Anesthesia Plan: Bier Block and Bier Block-LIDOCAINE ONLY   Post-op Pain Management:    Induction: Intravenous  PONV Risk Score and Plan: 3 and Ondansetron, Treatment may vary due to age or medical condition, Midazolam, Propofol infusion and TIVA  Airway Management Planned:   Additional Equipment: None  Intra-op Plan:   Post-operative Plan:   Informed Consent: I have reviewed the patients History and Physical, chart, labs and discussed the procedure including the risks, benefits and alternatives for the proposed anesthesia with the patient or authorized representative who has indicated his/her understanding and acceptance.     Dental advisory given  Plan Discussed with: CRNA  Anesthesia Plan Comments:        Anesthesia Quick Evaluation

## 2020-12-20 NOTE — Discharge Instructions (Addendum)

## 2020-12-21 ENCOUNTER — Encounter (HOSPITAL_COMMUNITY): Payer: Self-pay | Admitting: Orthopedic Surgery

## 2021-01-05 ENCOUNTER — Ambulatory Visit: Payer: 59 | Admitting: Psychiatry

## 2021-01-26 ENCOUNTER — Ambulatory Visit (INDEPENDENT_AMBULATORY_CARE_PROVIDER_SITE_OTHER): Payer: 59 | Admitting: Psychiatry

## 2021-01-26 ENCOUNTER — Other Ambulatory Visit: Payer: Self-pay

## 2021-01-26 DIAGNOSIS — E538 Deficiency of other specified B group vitamins: Secondary | ICD-10-CM

## 2021-01-26 DIAGNOSIS — Z630 Problems in relationship with spouse or partner: Secondary | ICD-10-CM

## 2021-01-26 DIAGNOSIS — D509 Iron deficiency anemia, unspecified: Secondary | ICD-10-CM

## 2021-01-26 DIAGNOSIS — Z9889 Other specified postprocedural states: Secondary | ICD-10-CM

## 2021-01-26 DIAGNOSIS — E282 Polycystic ovarian syndrome: Secondary | ICD-10-CM

## 2021-01-26 DIAGNOSIS — R7989 Other specified abnormal findings of blood chemistry: Secondary | ICD-10-CM

## 2021-01-26 DIAGNOSIS — F411 Generalized anxiety disorder: Secondary | ICD-10-CM

## 2021-01-26 DIAGNOSIS — R41840 Attention and concentration deficit: Secondary | ICD-10-CM | POA: Diagnosis not present

## 2021-01-26 DIAGNOSIS — E039 Hypothyroidism, unspecified: Secondary | ICD-10-CM

## 2021-01-26 DIAGNOSIS — F3342 Major depressive disorder, recurrent, in full remission: Secondary | ICD-10-CM

## 2021-01-26 DIAGNOSIS — U099 Post covid-19 condition, unspecified: Secondary | ICD-10-CM

## 2021-01-26 NOTE — Progress Notes (Signed)
Psychotherapy Progress Note [facility] [name], PhD LP  Patient ID: Pam Graham     MRN: [id number] Therapy format: Individual psychotherapy Date: 01/26/2021      Start: 4:07p     Stop: 4:55p     Time Spent: 48 min Location: In-person   Session narrative (presenting needs, interim history, self-report of stressors and symptoms, applications of prior therapy, status changes, and interventions made in session) Got her travel to [place] since last seen, part of the time with friend Joseph Art.  Caught COVID on return, including sneezing, congestion, bad fatigue and headache, was able to continue work from home.  Some post-COVID brain fog, loses train of thought and word finding.  Hand surgery was also scheduled, and thankfully precautions were revised before she was scheduled.    Currently recovering from trigger finger surgery, for which she has been out of work about a month.  Typically sleeps in when off work, some adjustment getting back to regular hours, not paralyzing.    While in [place], managed to [place] on the [facility], but it turned out to be much more challenging than advertised for taking suggestion to go the rougher high route, finding it to be very tiring, much more work than pleasure, and some lingering abrasions and injuries.  Had prepared to possibly get involved with Laurell Roof, her friend and guide, but he blew the fantasy as he made some mockery of her being out of shape for the hike.  On return, [name] picked her up, in a custom built BMW (paid for with a generous inheritance), and he has now bought a house, credibly getting out of the prolonged, suspicious situation with his estranged wife and professing clear-eyed interest in her and willingness to live together committed.  Cautiously affirmed both Mike's apparent work clarifying his intentions and her efforts before this to challenge him to be off the fence about commitment.  Briefly forecast  the possibility of him having a relapse in commitment phobia and/or shame-based depression as they enact the plan to move in together, and encouraged her to be ready to just ask him matter of factly if he's truly ready and ready to remind him she can be OK either way as long as he'll stay honest about his intentions and not resort to picking fights or portraying devotion while he indulges porn, sexting, or cheating.  Agreed it's important she not be able to be baited again into seeming like the hysteric.  Grateful for the foresight.  Therapeutic modalities: Cognitive Behavioral Therapy, Solution-Oriented/Positive Psychology, Ego-Supportive, and Assertiveness/Communication  Mental Status/Observations:  Appearance:   Casual     Behavior:  Appropriate  Motor:  Normal  Speech/Language:   Clear and Coherent  Affect:  Appropriate  Mood:  normal  Thought process:  normal  Thought content:    WNL  Sensory/Perceptual disturbances:    WNL  Orientation:  Fully oriented  Attention:  Good    Concentration:  Good  Memory:  WNL  Insight:    Good  Judgment:   Good  Impulse Control:  Good   Risk Assessment: Danger to Self: No Self-injurious Behavior: No Danger to Others: No Physical Aggression / Violence: No Duty to Warn: No Access to Firearms a concern: No  Assessment of progress:  progressing  Diagnosis:   ICD-10-CM   1. Generalized anxiety disorder - stable  F41.1     2. Recurrent major depressive disorder, in full remission ([place])  F33.42  3. Relationship problem between partners  Z63.0     4. Post-COVID chronic concentration deficit  R41.840    U09.9     5. Hypothyroidism, unspecified type  E03.9     6. Iron deficiency anemia, unspecified iron deficiency anemia type  D50.9     7. Low vitamin D level  R79.89     8. Low vitamin B12 level  E53.8     9. PCOS (polycystic ovarian syndrome)  E28.2     10. Status post surgery (trigger finger)  Z98.890      Plan:  Continuing  caution with [name], stick with communication recommendations.  Special attention to being ready to catch commitment phobia in progress and show [name] no shame, no blame, no obligation if he has cold feet, just stay honest Continue self-care for various medical conditions and nutritional issues Other recommendations/advice as may be noted above Continue to utilize previously learned skills ad lib Maintain medication as prescribed and work faithfully with relevant prescriber(s) if any changes are desired or seem indicated Call the clinic on-call service, present to ER, or call 911 if any life-threatening psychiatric crisis No follow-ups on file. Already scheduled visit in this office 04/20/2021.  Blanchie Serve, PhD [name], PhD LP Clinical Psychologist, [facility] [address] [place], [place] [address] [phone]

## 2021-03-09 ENCOUNTER — Ambulatory Visit: Payer: 59 | Admitting: Psychiatry

## 2021-03-28 ENCOUNTER — Other Ambulatory Visit: Payer: Self-pay

## 2021-03-28 ENCOUNTER — Ambulatory Visit (INDEPENDENT_AMBULATORY_CARE_PROVIDER_SITE_OTHER): Payer: 59 | Admitting: Psychiatry

## 2021-03-28 DIAGNOSIS — Z630 Problems in relationship with spouse or partner: Secondary | ICD-10-CM | POA: Diagnosis not present

## 2021-03-28 DIAGNOSIS — F411 Generalized anxiety disorder: Secondary | ICD-10-CM

## 2021-03-28 DIAGNOSIS — R69 Illness, unspecified: Secondary | ICD-10-CM

## 2021-03-28 DIAGNOSIS — F330 Major depressive disorder, recurrent, mild: Secondary | ICD-10-CM

## 2021-03-28 DIAGNOSIS — E282 Polycystic ovarian syndrome: Secondary | ICD-10-CM | POA: Diagnosis not present

## 2021-03-28 NOTE — Progress Notes (Signed)
Psychotherapy Progress Note [facility] Pam [place], PhD LP  Patient ID: Pam Graham)    MRN: [id number] Therapy format: Individual psychotherapy Date: 03/28/2021      Start: 4:06p     Stop: 4:56p     Time Spent: 50 min Location: In-person   Session narrative (presenting needs, interim history, self-report of stressors and symptoms, applications of prior therapy, status changes, and interventions made in session) Thinking now she needs to get out of the relationship with [name].  Last news, he bought a house and a car, within days started complaining about the place.  Early August, she had partially moved in when one Saturday he left without notice, couldn't find him a while, called him, he said he was on his way back from the old house with the dog, takes 2 more hours to get home, then when she asks him where he was he went into the old diatribe about her hounding him and how he won't do that, allusions to packing her bags.  Got herself moved back out, but wondering how she keeps "putting up with" treatment like this.  Supportively confronted that she isn't actually "putting up with" this, but dealing with it, actually, just as she has before.  Had wondered also if she was perimenopausal (but had evidence in April, during an "off" period, when an old flame came by).  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  Agreed it sounds pretty well done.  Affirmed that [name] is set in his ways, that his Parkinson's is not reason enough to pledge to him, that she never got "that married" to him in the first place (not at all, of course, but owing to her tendency to attach  hard).  Lately been going to work late, calling out, taking too many chances with being disciplined.  Encouraged to pick herself up and go on because staying home and calling out has been the pattern in depressions past, and she has life and work worth preserving.  Medically, PCOS is in need of further workup.  Will be seeing endocrinologist tomorrow.    Therapeutic modalities: Cognitive Behavioral Therapy and Solution-Oriented/Positive Psychology  Mental Status/Observations:  Appearance:   Casual and Neat     Behavior:  Appropriate  Motor:  Normal  Speech/Language:   Clear and Coherent  Affect:  Appropriate  Mood:  sad  Thought process:  normal  Thought content:    WNL  Sensory/Perceptual disturbances:    WNL  Orientation:  Fully oriented  Attention:  Good    Concentration:  Good  Memory:  WNL  Insight:    Good  Judgment:   Good  Impulse Control:  Fair   Risk Assessment: Danger to Self: No Self-injurious Behavior: No Danger to Others: No Physical Aggression / Violence: No Duty to Warn: No Access to Firearms a concern: No  Assessment of progress:  situational setback(s)  Diagnosis:   ICD-10-CM   1. Generalized anxiety disorder - stable  F41.1     2. Relationship problem between partners  Z63.0     3. Mild episode of recurrent major depressive disorder (HCC)  F33.0     4.  PCOS (polycystic ovarian syndrome)  E28.2     5. r/o current vitamin deficiencies  R69      Plan:  Work through [place] with [name] Don't let depression drive absenteeism or self-sabotage Follow through on self-care and health care Other recommendations/advice as may be noted above Continue to utilize previously learned skills ad lib Maintain medication as prescribed and work faithfully with relevant prescriber(s) if any changes are desired or seem indicated Call the clinic on-call service, 988/hotline, present to ER, or call 911 if any life-threatening psychiatric crisis Return for time as  available. Already scheduled visit in this office 04/20/2021.  Blanchie Serve, PhD [place], PhD LP Clinical Psychologist, [facility] [address] [place] [employer], [place] [address] [phone]

## 2021-04-20 ENCOUNTER — Ambulatory Visit: Payer: 59 | Admitting: Physician Assistant

## 2021-04-20 ENCOUNTER — Other Ambulatory Visit: Payer: Self-pay

## 2021-04-20 ENCOUNTER — Encounter: Payer: Self-pay | Admitting: Physician Assistant

## 2021-04-20 DIAGNOSIS — F3341 Major depressive disorder, recurrent, in partial remission: Secondary | ICD-10-CM | POA: Diagnosis not present

## 2021-04-20 DIAGNOSIS — Z630 Problems in relationship with spouse or partner: Secondary | ICD-10-CM

## 2021-04-20 DIAGNOSIS — F411 Generalized anxiety disorder: Secondary | ICD-10-CM

## 2021-04-20 MED ORDER — SERTRALINE HCL 100 MG PO TABS: 100.0000 mg | ORAL_TABLET | Freq: Every day | ORAL | 3 refills | Status: DC

## 2021-04-20 MED ORDER — ALPRAZOLAM 0.5 MG PO TABS: 0.2500 mg | ORAL_TABLET | Freq: Three times a day (TID) | ORAL | 1 refills | Status: DC | PRN

## 2021-04-20 NOTE — Progress Notes (Signed)
[facility]  Patient ID: Pam Graham,  MRN: [id number]  PCP: [facility]  Date of Evaluation: 04/20/2021 Time spent:20 minutes  Chief Complaint:  Chief Complaint   Anxiety; Depression; Follow-up     HISTORY/CURRENT STATUS: HPI For 6 month med check.  Feelings of guilt, grieving of relationship with SO, he has parkinsons, he was always healthy and active. Today is just a sad day for her and she's more emotional.   In general, she is not feeling depressed, just sad over the situation.  Patient denies loss of interest in usual activities and is able to enjoy things.  Denies decreased energy or motivation.  She is working full-time.  Appetite has not changed.  No extreme sadness, tearfulness, or feelings of hopelessness.  Denies any changes in concentration, making decisions or remembering things.  Sleeps well for the most part.  She does still get anxious, not really panic attacks but more of a generalized sense of unease, feeling like something bad is going to happen.  Xanax is still helpful.  Denies suicidal or homicidal thoughts.  Patient denies increased energy with decreased need for sleep, no increased talkativeness, no racing thoughts, no impulsivity or risky behaviors, no increased spending, no increased libido, no grandiosity, no increased irritability or anger, and no hallucinations.  Denies dizziness, syncope, seizures, numbness, tingling, tremor, tics, unsteady gait, slurred speech, confusion. Denies muscle or joint pain, stiffness, or dystonia.  Individual Medical History/ Review of Systems: Changes? :Yes   Surgery right trigger finger over the summer.   Past medications for mental health diagnoses include: Prozac, trazodone, Xanax  Allergies: Iohexol, Penicillins, and Clindamycin  Current Medications:  Current Outpatient Medications:    BLISOVI 24 FE 1-20 MG-MCG(24) tablet, Take 1 tablet by mouth daily., Disp: , Rfl:    clobetasol  (TEMOVATE) 0.05 % external solution, Apply 1 application topically 2 (two) times daily as needed (psoriasis)., Disp: , Rfl:    ibuprofen (ADVIL) 200 MG tablet, Take 400 mg by mouth every 6 (six) hours as needed for headache., Disp: , Rfl:    naphazoline-pheniramine (NAPHCON-A) 0.025-0.3 % ophthalmic solution, Place 1 drop into both eyes 4 (four) times daily as needed for eye irritation or allergies., Disp: , Rfl:    triamcinolone cream (KENALOG) 0.1 %, Apply 1 application topically 2 (two) times daily as needed (psoriasis)., Disp: , Rfl:    ALPRAZolam (XANAX) 0.5 MG tablet, Take 0.5-1 tablets (0.25-0.5 mg total) by mouth 3 (three) times daily as needed for anxiety., Disp: 30 tablet, Rfl: 1   cetirizine (ZYRTEC) 10 MG tablet, Take 10 mg by mouth daily. (Patient not taking: Reported on 04/20/2021), Disp: , Rfl:    Cholecalciferol (DIALYVITE VITAMIN D 5000) 125 MCG (5000 UT) capsule, Take 5,000 Units by mouth daily. (Patient not taking: Reported on 04/20/2021), Disp: , Rfl:    metFORMIN (GLUCOPHAGE) 500 MG tablet, Take 500 mg by mouth daily. For PCOS (Patient not taking: Reported on 04/20/2021), Disp: , Rfl:    MOUNJARO 2.5 MG/0.5ML Pen, SMARTSIG:2.5 Milligram(s) SUB-Q Once a Week, Disp: , Rfl:    NP THYROID 60 MG tablet, Take 60 mg by mouth daily. (Patient not taking: Reported on 04/20/2021), Disp: , Rfl:    sertraline (ZOLOFT) 100 MG tablet, Take 1 tablet (100 mg total) by mouth daily., Disp: 90 tablet, Rfl: 3   traMADol (ULTRAM) 50 MG tablet, Take 1 tablet (50 mg total) by mouth every 6 (six) hours as needed. (Patient not taking: Reported on 04/20/2021), Disp:  20 tablet, Rfl: 0   vitamin B-12 (CYANOCOBALAMIN) 1000 MCG tablet, Take 1,000 mcg by mouth daily. (Patient not taking: Reported on 04/20/2021), Disp: , Rfl:  Medication Side Effects: none  Family Medical/ Social History: Changes? Had a change in management a few months ago, work is ok.   MENTAL HEALTH EXAM:  There were no vitals taken for  this visit.There is no height or weight on file to calculate BMI.  General Appearance: Casual and Well Groomed  Eye Contact:  Good  Speech:  Clear and Coherent and Normal Rate  Volume:  Normal  Mood:   Sad  Affect:  Congruent and Tearful  Thought Process:  Goal Directed and Descriptions of Associations: Circumstantial  Orientation:  Full (Time, Place, and Person)  Thought Content: Logical   Suicidal Thoughts:  No  Homicidal Thoughts:  No  Memory:  WNL  Judgement:  Good  Insight:  Good  Psychomotor Activity:  Normal  Concentration:  Concentration: Good  Recall:  Good  Fund of Knowledge: Good  Language: Good  Assets:  Desire for Improvement  ADL's:  Intact  Cognition: WNL  Prognosis:  Good    DIAGNOSES:    ICD-10-CM   1. Recurrent major depressive disorder, in partial remission ([place])  F33.41     2. Generalized anxiety disorder - stable  F41.1     3. Relationship problem between partners  Z63.0        Receiving Psychotherapy: Yes    RECOMMENDATIONS:  PDMP was reviewed.  Last Xanax filled 08/04/2020. I provided 20 minutes of face to face time during this encounter, including time spent before and after the visit in records review, medical decision making, and charting.  She is doing well as far as current medications go.  She will continue to see Dr. Rica Mote for therapy for the situational anxiety and sadness. Continue Xanax 0.5 mg, 1/2-1 p.o. 3 times daily as needed. Continue Zoloft 100 mg, 1 p.o. daily. Continue therapy. Return in 6 months.  Donnal Moat, PA-C

## 2021-05-02 ENCOUNTER — Ambulatory Visit: Payer: 59 | Admitting: Psychiatry

## 2021-05-30 ENCOUNTER — Ambulatory Visit: Payer: 59 | Admitting: Psychiatry

## 2021-10-19 ENCOUNTER — Encounter: Payer: Self-pay | Admitting: Physician Assistant

## 2021-10-19 ENCOUNTER — Ambulatory Visit (INDEPENDENT_AMBULATORY_CARE_PROVIDER_SITE_OTHER): Payer: 59 | Admitting: Physician Assistant

## 2021-10-19 DIAGNOSIS — F3341 Major depressive disorder, recurrent, in partial remission: Secondary | ICD-10-CM

## 2021-10-19 DIAGNOSIS — F411 Generalized anxiety disorder: Secondary | ICD-10-CM

## 2021-10-19 MED ORDER — ALPRAZOLAM 0.5 MG PO TABS: 0.2500 mg | ORAL_TABLET | Freq: Three times a day (TID) | ORAL | 1 refills | Status: DC | PRN

## 2021-10-19 NOTE — Progress Notes (Signed)
[facility] ? ?Patient ID: Pam Graham,  ?MRN: [id number] ? ?PCP: Pcp, No ? ?Date of Evaluation: 10/19/2021 ?Time spent:20 minutes ? ?Chief Complaint:  ?Chief Complaint   ?Anxiety; Depression; Follow-up ?  ? ? ?HISTORY/CURRENT STATUS: ?HPI For 6 month med check. ? ?Will be losing her job soon. Is a Marine scientist for [employer]. They're changing the way they do things. Hopes she can have a little bit of time off before she decides what else she will do. ? ?Overall though she feels like her medications are working well.  Does not feel that the Zoloft needs to be increased or decreased.  She is under some stress with her boyfriend who has Parkinson's disease and is not doing well.  She is able to enjoy things when she can.  Energy and motivation are fair to good depending on the day.  Not isolating.  ADLs and personal hygiene are normal.  Appetite is normal and weight is stable.  No suicidal or homicidal thoughts. ? ?Denies dizziness, syncope, seizures, numbness, tingling, tremor, tics, unsteady gait, slurred speech, confusion. Denies muscle or joint pain, stiffness, or dystonia. ? ?Individual Medical History/ Review of Systems: Changes? :No    ? ?Past medications for mental health diagnoses include: ?Prozac, trazodone, Xanax ? ?Allergies: Iohexol, Penicillins, and Clindamycin ? ?Current Medications:  ?Current Outpatient Medications:  ?  BLISOVI 24 FE 1-20 MG-MCG(24) tablet, Take 1 tablet by mouth daily., Disp: , Rfl:  ?  clobetasol (TEMOVATE) 0.05 % external solution, Apply 1 application topically 2 (two) times daily as needed (psoriasis)., Disp: , Rfl:  ?  ibuprofen (ADVIL) 200 MG tablet, Take 400 mg by mouth every 6 (six) hours as needed for headache., Disp: , Rfl:  ?  MOUNJARO 2.5 MG/0.5ML Pen, SMARTSIG:2.5 Milligram(s) SUB-Q Once a Week, Disp: , Rfl:  ?  sertraline (ZOLOFT) 100 MG tablet, Take 1 tablet (100 mg total) by mouth daily., Disp: 90 tablet, Rfl: 3 ?  triamcinolone cream (KENALOG) 0.1 %,  Apply 1 application topically 2 (two) times daily as needed (psoriasis)., Disp: , Rfl:  ?  ALPRAZolam (XANAX) 0.5 MG tablet, Take 0.5-1 tablets (0.25-0.5 mg total) by mouth 3 (three) times daily as needed for anxiety., Disp: 30 tablet, Rfl: 1 ?  cetirizine (ZYRTEC) 10 MG tablet, Take 10 mg by mouth daily. (Patient not taking: Reported on 04/20/2021), Disp: , Rfl:  ?  Cholecalciferol (DIALYVITE VITAMIN D 5000) 125 MCG (5000 UT) capsule, Take 5,000 Units by mouth daily. (Patient not taking: Reported on 04/20/2021), Disp: , Rfl:  ?  metFORMIN (GLUCOPHAGE) 500 MG tablet, Take 500 mg by mouth daily. For PCOS (Patient not taking: Reported on 04/20/2021), Disp: , Rfl:  ?  naphazoline-pheniramine (NAPHCON-A) 0.025-0.3 % ophthalmic solution, Place 1 drop into both eyes 4 (four) times daily as needed for eye irritation or allergies. (Patient not taking: Reported on 10/19/2021), Disp: , Rfl:  ?  NP THYROID 60 MG tablet, Take 60 mg by mouth daily. (Patient not taking: Reported on 04/20/2021), Disp: , Rfl:  ?  traMADol (ULTRAM) 50 MG tablet, Take 1 tablet (50 mg total) by mouth every 6 (six) hours as needed. (Patient not taking: Reported on 04/20/2021), Disp: 20 tablet, Rfl: 0 ?  vitamin B-12 (CYANOCOBALAMIN) 1000 MCG tablet, Take 1,000 mcg by mouth daily. (Patient not taking: Reported on 04/20/2021), Disp: , Rfl:  ?Medication Side Effects: none ? ?Family Medical/ Social History: Changes?  See HPI. ? ?MENTAL HEALTH EXAM: ? ?There were no vitals taken for  this visit.There is no height or weight on file to calculate BMI.  ?General Appearance: Casual and Well Groomed  ?Eye Contact:  Good  ?Speech:  Clear and Coherent and Normal Rate  ?Volume:  Normal  ?Mood:  Euthymic  ?Affect:  Congruent  ?Thought Process:  Goal Directed and Descriptions of Associations: Circumstantial  ?Orientation:  Full (Time, Place, and Person)  ?Thought Content: Logical   ?Suicidal Thoughts:  No  ?Homicidal Thoughts:  No  ?Memory:  WNL  ?Judgement:  Good   ?Insight:  Good  ?Psychomotor Activity:  Normal  ?Concentration:  Concentration: Good  ?Recall:  Good  ?Fund of Knowledge: Good  ?Language: Good  ?Assets:  Desire for Improvement  ?ADL's:  Intact  ?Cognition: WNL  ?Prognosis:  Good  ? ? ?DIAGNOSES:  ?  ICD-10-CM   ?1. Recurrent major depressive disorder, in partial remission (HCC)  F33.41   ?  ?2. Generalized anxiety disorder - stable  F41.1   ?  ? ? ?Receiving Psychotherapy: Yes  ? ? ?RECOMMENDATIONS:  ?PDMP was reviewed.  Last Xanax filled 04/20/2021. ?I provided 20 minutes of face to face time during this encounter, including time spent before and after the visit in records review, medical decision making, counseling pertinent to today's visit, and charting.  ?She is doing well with current medications so no changes will be made. ? ?Continue Xanax 0.5 mg, 1/2-1 p.o. 3 times daily as needed. ?Continue Zoloft 100 mg, 1 p.o. daily. ?Continue therapy. ?Return in 3 months. ? ?Donnal Moat, PA-C  ?

## 2022-01-02 ENCOUNTER — Ambulatory Visit: Payer: 59 | Admitting: Physician Assistant

## 2022-01-18 ENCOUNTER — Ambulatory Visit (INDEPENDENT_AMBULATORY_CARE_PROVIDER_SITE_OTHER): Payer: 59 | Admitting: Physician Assistant

## 2022-01-18 ENCOUNTER — Encounter: Payer: Self-pay | Admitting: Physician Assistant

## 2022-01-18 DIAGNOSIS — F411 Generalized anxiety disorder: Secondary | ICD-10-CM | POA: Diagnosis not present

## 2022-01-18 DIAGNOSIS — F3341 Major depressive disorder, recurrent, in partial remission: Secondary | ICD-10-CM

## 2022-01-18 MED ORDER — ALPRAZOLAM 0.5 MG PO TABS: 0.2500 mg | ORAL_TABLET | Freq: Three times a day (TID) | ORAL | 1 refills | Status: DC | PRN

## 2022-01-18 MED ORDER — SERTRALINE HCL 100 MG PO TABS: 100.0000 mg | ORAL_TABLET | Freq: Every day | ORAL | 3 refills | Status: DC

## 2022-01-18 NOTE — Progress Notes (Signed)
[facility]  Patient ID: Pam Graham,  MRN: [id number]  PCP: Pcp, No  Date of Evaluation: 01/18/2022 Time spent:20 minutes  Chief Complaint:  Chief Complaint   Anxiety; Depression; Follow-up    HISTORY/CURRENT STATUS: HPI For 6 month med check.  Lost her job but knew it was coming. Is taking advantage of being off work for a little while. Had been taking care of her BF who has Parkinson's. Back at home now. Thinking about going to [place] in the fall. Will stay a month if she goes.   Meds are working as well as they can. Patient is able to enjoy things.  Energy and motivation are good. No extreme sadness, tearfulness, or feelings of hopelessness.  Sleeps well most of the time. ADLs and personal hygiene are normal.   Denies any changes in concentration, making decisions, or remembering things.  Appetite has not changed.  Weight is stable. Anxiety is well controlled. Xanax helps when needed. Not usually having PA though.  Denies suicidal or homicidal thoughts.  Patient denies increased energy with decreased need for sleep, increased talkativeness, racing thoughts, impulsivity or risky behaviors, increased spending, increased libido, grandiosity, increased irritability or anger, paranoia, and no hallucinations.  Denies dizziness, syncope, seizures, numbness, tingling, tremor, tics, unsteady gait, slurred speech, confusion. Denies muscle or joint pain, stiffness, or dystonia.  Individual Medical History/ Review of Systems: Changes? :No     Past medications for mental health diagnoses include: Prozac, trazodone, Xanax  Allergies: Iohexol, Penicillins, and Clindamycin  Current Medications:  Current Outpatient Medications:    BLISOVI 24 FE 1-20 MG-MCG(24) tablet, Take 1 tablet by mouth daily., Disp: , Rfl:    clobetasol (TEMOVATE) 0.05 % external solution, Apply 1 application topically 2 (two) times daily as needed (psoriasis)., Disp: , Rfl:    ibuprofen (ADVIL) 200 MG  tablet, Take 400 mg by mouth every 6 (six) hours as needed for headache., Disp: , Rfl:    MOUNJARO 2.5 MG/0.5ML Pen, SMARTSIG:2.5 Milligram(s) SUB-Q Once a Week, Disp: , Rfl:    triamcinolone cream (KENALOG) 0.1 %, Apply 1 application topically 2 (two) times daily as needed (psoriasis)., Disp: , Rfl:    ALPRAZolam (XANAX) 0.5 MG tablet, Take 0.5-1 tablets (0.25-0.5 mg total) by mouth 3 (three) times daily as needed for anxiety., Disp: 30 tablet, Rfl: 1   cetirizine (ZYRTEC) 10 MG tablet, Take 10 mg by mouth daily. (Patient not taking: Reported on 04/20/2021), Disp: , Rfl:    Cholecalciferol (DIALYVITE VITAMIN D 5000) 125 MCG (5000 UT) capsule, Take 5,000 Units by mouth daily. (Patient not taking: Reported on 04/20/2021), Disp: , Rfl:    metFORMIN (GLUCOPHAGE) 500 MG tablet, Take 500 mg by mouth daily. For PCOS (Patient not taking: Reported on 04/20/2021), Disp: , Rfl:    naphazoline-pheniramine (NAPHCON-A) 0.025-0.3 % ophthalmic solution, Place 1 drop into both eyes 4 (four) times daily as needed for eye irritation or allergies. (Patient not taking: Reported on 10/19/2021), Disp: , Rfl:    NP THYROID 60 MG tablet, Take 60 mg by mouth daily. (Patient not taking: Reported on 04/20/2021), Disp: , Rfl:    sertraline (ZOLOFT) 100 MG tablet, Take 1 tablet (100 mg total) by mouth daily., Disp: 90 tablet, Rfl: 3   vitamin B-12 (CYANOCOBALAMIN) 1000 MCG tablet, Take 1,000 mcg by mouth daily. (Patient not taking: Reported on 04/20/2021), Disp: , Rfl:  Medication Side Effects: none  Family Medical/ Social History: Changes?  See HPI.  MENTAL HEALTH EXAM:  There were no  vitals taken for this visit.There is no height or weight on file to calculate BMI.  General Appearance: Casual and Well Groomed  Eye Contact:  Good  Speech:  Clear and Coherent and Normal Rate  Volume:  Normal  Mood:  Euthymic  Affect:  Congruent  Thought Process:  Goal Directed and Descriptions of Associations: Circumstantial   Orientation:  Full (Time, Place, and Person)  Thought Content: Logical   Suicidal Thoughts:  No  Homicidal Thoughts:  No  Memory:  WNL  Judgement:  Good  Insight:  Good  Psychomotor Activity:  Normal  Concentration:  Concentration: Good  Recall:  Good  Fund of Knowledge: Good  Language: Good  Assets:  Desire for Improvement Financial Resources/Insurance Housing Transportation  ADL's:  Intact  Cognition: WNL  Prognosis:  Good    DIAGNOSES:    ICD-10-CM   1. Recurrent major depressive disorder, in partial remission ([place])  F33.41     2. Generalized anxiety disorder - stable  F41.1        Receiving Psychotherapy: Yes   RECOMMENDATIONS:  PDMP was reviewed.  Last Xanax filled 01/10/2022. I provided 20  minutes of face to face time during this encounter, including time spent before and after the visit in records review, medical decision making, counseling pertinent to today's visit, and charting.   She's doing well as far as her mental health meds go.  No changes.   Continue Xanax 0.5 mg, 1/2-1 p.o. 3 times daily as needed. Continue Zoloft 100 mg, 1 p.o. daily. Continue therapy. Return in 4 months.  Donnal Moat, PA-C

## 2022-04-25 ENCOUNTER — Other Ambulatory Visit: Payer: Self-pay | Admitting: Physician Assistant

## 2022-05-22 ENCOUNTER — Encounter: Payer: Self-pay | Admitting: Physician Assistant

## 2022-05-22 ENCOUNTER — Ambulatory Visit (INDEPENDENT_AMBULATORY_CARE_PROVIDER_SITE_OTHER): Payer: BC Managed Care – PPO | Admitting: Physician Assistant

## 2022-05-22 DIAGNOSIS — F3341 Major depressive disorder, recurrent, in partial remission: Secondary | ICD-10-CM

## 2022-05-22 DIAGNOSIS — F411 Generalized anxiety disorder: Secondary | ICD-10-CM

## 2022-05-22 NOTE — Progress Notes (Signed)
[facility]  Patient ID: Pam Graham,  MRN: [id number]  PCP: Pcp, No  Date of Evaluation: 05/22/2022 Time spent:20 minutes  Chief Complaint:  Chief Complaint   Anxiety; Depression; Follow-up    HISTORY/CURRENT STATUS: HPI For 4 month med check.  Meds are working well. Has only taken the Xanax a few times since her last visit in July.  She does have situational anxiety sometimes but not often.  No panic attacks.  Not working now and "I'm ok with that." Martin Majestic to [place] last month on vacation and enjoyed that. Is sleeping well.   Patient is able to enjoy things.  Energy and motivation are good.  No extreme sadness, tearfulness, or feelings of hopelessness.   ADLs and personal hygiene are normal.   Denies any changes in concentration, making decisions, or remembering things.  Appetite has not changed.  Weight is stable.  Denies suicidal or homicidal thoughts.  Patient denies increased energy with decreased need for sleep, increased talkativeness, racing thoughts, impulsivity or risky behaviors, increased spending, increased libido, grandiosity, increased irritability or anger, paranoia, or hallucinations.  Denies dizziness, syncope, seizures, numbness, tingling, tremor, tics, unsteady gait, slurred speech, confusion. Denies muscle or joint pain, stiffness, or dystonia.  Individual Medical History/ Review of Systems: Changes? :No     Past medications for mental health diagnoses include: Prozac, trazodone, Xanax  Allergies: Iohexol, Penicillins, and Clindamycin  Current Medications:  Current Outpatient Medications:    ALPRAZolam (XANAX) 0.5 MG tablet, Take 0.5-1 tablets (0.25-0.5 mg total) by mouth 3 (three) times daily as needed for anxiety., Disp: 30 tablet, Rfl: 1   BLISOVI 24 FE 1-20 MG-MCG(24) tablet, Take 1 tablet by mouth daily., Disp: , Rfl:    clobetasol (TEMOVATE) 0.05 % external solution, Apply 1 application topically 2 (two) times daily as needed  (psoriasis)., Disp: , Rfl:    ibuprofen (ADVIL) 200 MG tablet, Take 400 mg by mouth every 6 (six) hours as needed for headache., Disp: , Rfl:    MOUNJARO 2.5 MG/0.5ML Pen, SMARTSIG:2.5 Milligram(s) SUB-Q Once a Week, Disp: , Rfl:    sertraline (ZOLOFT) 100 MG tablet, TAKE 1 TABLET(100 MG) BY MOUTH DAILY, Disp: 90 tablet, Rfl: 3   triamcinolone cream (KENALOG) 0.1 %, Apply 1 application topically 2 (two) times daily as needed (psoriasis)., Disp: , Rfl:    cetirizine (ZYRTEC) 10 MG tablet, Take 10 mg by mouth daily. (Patient not taking: Reported on 04/20/2021), Disp: , Rfl:    Cholecalciferol (DIALYVITE VITAMIN D 5000) 125 MCG (5000 UT) capsule, Take 5,000 Units by mouth daily. (Patient not taking: Reported on 04/20/2021), Disp: , Rfl:    metFORMIN (GLUCOPHAGE) 500 MG tablet, Take 500 mg by mouth daily. For PCOS (Patient not taking: Reported on 04/20/2021), Disp: , Rfl:    naphazoline-pheniramine (NAPHCON-A) 0.025-0.3 % ophthalmic solution, Place 1 drop into both eyes 4 (four) times daily as needed for eye irritation or allergies. (Patient not taking: Reported on 10/19/2021), Disp: , Rfl:    NP THYROID 60 MG tablet, Take 60 mg by mouth daily. (Patient not taking: Reported on 04/20/2021), Disp: , Rfl:    vitamin B-12 (CYANOCOBALAMIN) 1000 MCG tablet, Take 1,000 mcg by mouth daily. (Patient not taking: Reported on 04/20/2021), Disp: , Rfl:  Medication Side Effects: none  Family Medical/ Social History: Changes?  Her boyfriend is having problems with his heart and will have a procedure on his mitral valve at some point in the near future.  MENTAL HEALTH EXAM:  There were no  vitals taken for this visit.There is no height or weight on file to calculate BMI.  General Appearance: Casual and Well Groomed  Eye Contact:  Good  Speech:  Clear and Coherent and Normal Rate  Volume:  Normal  Mood:  Euthymic  Affect:  Congruent  Thought Process:  Goal Directed and Descriptions of Associations: Circumstantial   Orientation:  Full (Time, Place, and Person)  Thought Content: Logical   Suicidal Thoughts:  No  Homicidal Thoughts:  No  Memory:  WNL  Judgement:  Good  Insight:  Good  Psychomotor Activity:  Normal  Concentration:  Concentration: Good and Attention Span: Good  Recall:  Good  Fund of Knowledge: Good  Language: Good  Assets:  Desire for Improvement Financial Resources/Insurance Housing Transportation  ADL's:  Intact  Cognition: WNL  Prognosis:  Good   DIAGNOSES:    ICD-10-CM   1. Recurrent major depressive disorder, in partial remission ([place])  F33.41     2. Generalized anxiety disorder - stable  F41.1       Receiving Psychotherapy: Yes   RECOMMENDATIONS:  PDMP was reviewed.  Last Xanax filled 01/10/2022. I provided 20 minutes of face to face time during this encounter, including time spent before and after the visit in records review, medical decision making, counseling pertinent to today's visit, and charting.   I am glad to see her doing well.  No changes needed.  Continue Xanax 0.5 mg, 1/2-1 p.o. 3 times daily as needed. Continue Zoloft 100 mg, 1 p.o. daily. Continue therapy. Return in 3 months.  Donnal Moat, PA-C

## 2022-08-22 ENCOUNTER — Ambulatory Visit: Payer: 59 | Admitting: Physician Assistant

## 2022-09-19 ENCOUNTER — Ambulatory Visit: Payer: 59 | Admitting: Physician Assistant

## 2023-01-22 ENCOUNTER — Ambulatory Visit: Payer: BC Managed Care – PPO | Admitting: Physician Assistant

## 2023-02-15 ENCOUNTER — Ambulatory Visit: Payer: BC Managed Care – PPO | Admitting: Physician Assistant

## 2023-05-01 ENCOUNTER — Other Ambulatory Visit: Payer: Self-pay

## 2023-05-01 ENCOUNTER — Telehealth: Payer: Self-pay | Admitting: Physician Assistant

## 2023-05-01 MED ORDER — SERTRALINE HCL 100 MG PO TABS: 100.0000 mg | ORAL_TABLET | Freq: Every day | ORAL | 0 refills | Status: DC

## 2023-05-01 NOTE — Telephone Encounter (Signed)
Patient called in for refill on Sertraline 100mg . Ph: [phone] Appt 11/21 Pharmacy [facility] [address] [place]

## 2023-05-01 NOTE — Telephone Encounter (Signed)
pended

## 2023-05-16 ENCOUNTER — Encounter: Payer: Self-pay | Admitting: Physician Assistant

## 2023-05-16 ENCOUNTER — Telehealth: Payer: BC Managed Care – PPO | Admitting: Physician Assistant

## 2023-05-16 DIAGNOSIS — F411 Generalized anxiety disorder: Secondary | ICD-10-CM

## 2023-05-16 DIAGNOSIS — F3342 Major depressive disorder, recurrent, in full remission: Secondary | ICD-10-CM

## 2023-05-16 MED ORDER — SERTRALINE HCL 100 MG PO TABS: 100.0000 mg | ORAL_TABLET | Freq: Every day | ORAL | 1 refills | Status: DC

## 2023-05-16 MED ORDER — ALPRAZOLAM 0.5 MG PO TABS: 0.2500 mg | ORAL_TABLET | Freq: Three times a day (TID) | ORAL | 1 refills | Status: DC | PRN

## 2023-05-16 NOTE — Progress Notes (Signed)
[facility]  Patient ID: Pam Graham,  MRN: [id number]  PCP: Pcp, No  Date of Evaluation: 05/16/2023 Time spent:25 minutes  Chief Complaint:  Chief Complaint   Depression; Anxiety; Follow-up   Virtual Visit via Telehealth  I connected with patient by a video enabled telemedicine application with their informed consent, and verified patient privacy and that I am speaking with the correct person using two identifiers.  I am private, in my office and the patient is at home.  I discussed the limitations, risks, security and privacy concerns of performing an evaluation and management service by video and the availability of in person appointments. I also discussed with the patient that there may be a patient responsible charge related to this service. The patient expressed understanding and agreed to proceed.   I discussed the assessment and treatment plan with the patient. The patient was provided an opportunity to ask questions and all were answered. The patient agreed with the plan and demonstrated an understanding of the instructions.   The patient was advised to call back or seek an in-person evaluation if the symptoms worsen or if the condition fails to improve as anticipated.  I provided 25 minutes of non-face-to-face time during this encounter.  HISTORY/CURRENT STATUS: HPI For routine med check.  Has been doing well. Patient is able to enjoy things.  Energy and motivation are good   No extreme sadness, tearfulness, or feelings of hopelessness.  Sleeps well most of the time. ADLs and personal hygiene are normal.   Denies any changes in concentration, making decisions, or remembering things.  Appetite has not changed.  Weight is stable.  She has She only has anxiety and takes the Xanax which is helpful.  It is not frequent at all.  Denies suicidal or homicidal thoughts.   Patient denies increased energy with decreased need for sleep, increased talkativeness, racing  thoughts, impulsivity or risky behaviors, increased spending, increased libido, grandiosity, increased irritability or anger, paranoia, or hallucinations.  Denies dizziness, syncope, seizures, numbness, tingling, tremor, tics, unsteady gait, slurred speech, confusion. Denies muscle or joint pain, stiffness, or dystonia.  Individual Medical History/ Review of Systems: Changes? :No     Past medications for mental health diagnoses include: Prozac, trazodone, Xanax  Allergies: Iohexol, Penicillins, and Clindamycin  Current Medications:  Current Outpatient Medications:    BLISOVI 24 FE 1-20 MG-MCG(24) tablet, Take 1 tablet by mouth daily., Disp: , Rfl:    ibuprofen (ADVIL) 200 MG tablet, Take 400 mg by mouth every 6 (six) hours as needed for headache., Disp: , Rfl:    MOUNJARO 2.5 MG/0.5ML Pen, SMARTSIG:2.5 Milligram(s) SUB-Q Once a Week, Disp: , Rfl:    triamcinolone cream (KENALOG) 0.1 %, Apply 1 application topically 2 (two) times daily as needed (psoriasis)., Disp: , Rfl:    ALPRAZolam (XANAX) 0.5 MG tablet, Take 0.5-1 tablets (0.25-0.5 mg total) by mouth 3 (three) times daily as needed for anxiety., Disp: 30 tablet, Rfl: 1   cetirizine (ZYRTEC) 10 MG tablet, Take 10 mg by mouth daily. (Patient not taking: Reported on 04/20/2021), Disp: , Rfl:    Cholecalciferol (DIALYVITE VITAMIN D 5000) 125 MCG (5000 UT) capsule, Take 5,000 Units by mouth daily. (Patient not taking: Reported on 04/20/2021), Disp: , Rfl:    clobetasol (TEMOVATE) 0.05 % external solution, Apply 1 application topically 2 (two) times daily as needed (psoriasis). (Patient not taking: Reported on 05/16/2023), Disp: , Rfl:    metFORMIN (GLUCOPHAGE) 500 MG tablet, Take 500 mg by mouth  daily. For PCOS (Patient not taking: Reported on 04/20/2021), Disp: , Rfl:    naphazoline-pheniramine (NAPHCON-A) 0.025-0.3 % ophthalmic solution, Place 1 drop into both eyes 4 (four) times daily as needed for eye irritation or allergies. (Patient not  taking: Reported on 10/19/2021), Disp: , Rfl:    NP THYROID 60 MG tablet, Take 60 mg by mouth daily. (Patient not taking: Reported on 04/20/2021), Disp: , Rfl:    sertraline (ZOLOFT) 100 MG tablet, Take 1 tablet (100 mg total) by mouth daily., Disp: 90 tablet, Rfl: 1   vitamin B-12 (CYANOCOBALAMIN) 1000 MCG tablet, Take 1,000 mcg by mouth daily. (Patient not taking: Reported on 04/20/2021), Disp: , Rfl:  Medication Side Effects: none  Family Medical/ Social History: Changes?  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  MENTAL HEALTH EXAM:  There were no vitals taken for this visit.There is no height or weight on file to calculate BMI.  General Appearance: Casual and Well Groomed  Eye Contact:  Good  Speech:  Clear and Coherent and Normal Rate  Volume:  Normal  Mood:  Euthymic  Affect:  Congruent  Thought Process:  Goal Directed and Descriptions of Associations: Circumstantial  Orientation:  Full (Time, Place, and Person)  Thought Content: Logical   Suicidal Thoughts:  No  Homicidal Thoughts:  No  Memory:  WNL  Judgement:  Good  Insight:  Good  Psychomotor Activity:  Normal  Concentration:  Concentration: Good and Attention Span: Good  Recall:  Good  Fund of Knowledge: Good  Language: Good  Assets:  Desire for Improvement Financial Resources/Insurance Housing Transportation  ADL's:  Intact  Cognition: WNL  Prognosis:  Good   DIAGNOSES:    ICD-10-CM   1. Recurrent major depression in full remission (HCC)  F33.42     2. Generalized anxiety disorder - stable  F41.1       Receiving Psychotherapy: No   RECOMMENDATIONS:  PDMP was reviewed.  Last Xanax filled 01/10/2022. I provided 25 minutes of non-face-to-face time during this encounter, including time spent before and after the visit in records review, medical decision making, counseling pertinent to today's visit, and charting.   [name] is doing well so no changes will be made.  Continue Xanax 0.5 mg, 1/2-1 p.o. 3  times daily as needed. Continue Zoloft 100 mg, 1 p.o. daily. Return in 6 months.  Melony Overly, PA-C

## 2023-09-11 ENCOUNTER — Ambulatory Visit: Payer: BC Managed Care – PPO | Admitting: Psychiatry

## 2023-10-29 ENCOUNTER — Encounter: Payer: Self-pay | Admitting: Physician Assistant

## 2023-10-29 ENCOUNTER — Telehealth: Payer: BC Managed Care – PPO | Admitting: Physician Assistant

## 2023-10-29 DIAGNOSIS — F3342 Major depressive disorder, recurrent, in full remission: Secondary | ICD-10-CM

## 2023-10-29 DIAGNOSIS — F411 Generalized anxiety disorder: Secondary | ICD-10-CM | POA: Diagnosis not present

## 2023-10-29 MED ORDER — SERTRALINE HCL 100 MG PO TABS: 100.0000 mg | ORAL_TABLET | Freq: Every day | ORAL | 1 refills | Status: DC

## 2023-10-29 NOTE — Progress Notes (Signed)
 [facility]  Patient ID: Pam Graham,  MRN: [id number]  PCP: Pcp, No  Date of Evaluation: 10/29/2023 Time spent:20 minutes  Chief Complaint:  Chief Complaint   Anxiety; Depression; Follow-up    Virtual Visit via Telehealth  I connected with patient by a video enabled telemedicine application with their informed consent, and verified patient privacy and that I am speaking with the correct person using two identifiers.  I am private, in my office and the patient is at home.  I discussed the limitations, risks, security and privacy concerns of performing an evaluation and management service by video and the availability of in person appointments. I also discussed with the patient that there may be a patient responsible charge related to this service. The patient expressed understanding and agreed to proceed.   I discussed the assessment and treatment plan with the patient. The patient was provided an opportunity to ask questions and all were answered. The patient agreed with the plan and demonstrated an understanding of the instructions.   The patient was advised to call back or seek an in-person evaluation if the symptoms worsen or if the condition fails to improve as anticipated.  I provided 20 minutes of non-face-to-face time during this encounter.  HISTORY/CURRENT STATUS: HPI For routine med check.  Doing well.  Has been overwhelmed sometimes, possibly d/t hormones, new job in Jan.  Doesn't feel depressed.  Patient is able to enjoy things.  Energy and motivation are good.  Work is going well.  Started a new job earlier this year No extreme sadness, tearfulness, or feelings of hopelessness.  Sleeps well most of the time. ADLs and personal hygiene are normal.   Denies any changes in concentration, making decisions, or remembering things.  Appetite has not changed.  Weight is stable.  Anxiety is well-controlled.  Rarely takes the Xanax .  Denies suicidal or homicidal  thoughts.  Patient denies increased energy with decreased need for sleep, increased talkativeness, racing thoughts, impulsivity or risky behaviors, increased spending, increased libido, grandiosity, increased irritability or anger, paranoia, or hallucinations.  Denies dizziness, syncope, seizures, numbness, tingling, tremor, tics, unsteady gait, slurred speech, confusion. Denies muscle or joint pain, stiffness, or dystonia.  Individual Medical History/ Review of Systems: Changes? :No   menopausal, will start Lo-lo estrin soon.   Past medications for mental health diagnoses include: Prozac , trazodone , Xanax   Allergies: Iohexol, Penicillins, and Clindamycin  Current Medications:  Current Outpatient Medications:    ALPRAZolam  (XANAX ) 0.5 MG tablet, Take 0.5-1 tablets (0.25-0.5 mg total) by mouth 3 (three) times daily as needed for anxiety., Disp: 30 tablet, Rfl: 1   cetirizine (ZYRTEC) 10 MG tablet, Take 10 mg by mouth daily., Disp: , Rfl:    clobetasol (TEMOVATE) 0.05 % external solution, Apply 1 application  topically 2 (two) times daily as needed (psoriasis)., Disp: , Rfl:    ibuprofen  (ADVIL ) 200 MG tablet, Take 400 mg by mouth every 6 (six) hours as needed for headache., Disp: , Rfl:    LO LOESTRIN  FE 1 MG-10 MCG / 10 MCG tablet, Take 1 tablet by mouth daily., Disp: , Rfl:    MOUNJARO 2.5 MG/0.5ML Pen, , Disp: , Rfl:    triamcinolone cream (KENALOG) 0.1 %, Apply 1 application  topically 2 (two) times daily as needed (psoriasis)., Disp: , Rfl:    sertraline  (ZOLOFT ) 100 MG tablet, Take 1 tablet (100 mg total) by mouth daily., Disp: 90 tablet, Rfl: 1 Medication Side Effects: none  Family Medical/ Social History: Changes?  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  MENTAL HEALTH EXAM:  There were no vitals taken for this visit.There is no height or weight on file to calculate BMI.  General Appearance: Casual and Well Groomed  Eye Contact:  Good  Speech:  Clear and Coherent and Normal Rate   Volume:  Normal  Mood:  Euthymic  Affect:  Congruent  Thought Process:  Goal Directed and Descriptions of Associations: Circumstantial  Orientation:  Full (Time, Place, and Person)  Thought Content: Logical   Suicidal Thoughts:  No  Homicidal Thoughts:  No  Memory:  WNL  Judgement:  Good  Insight:  Good  Psychomotor Activity:  Normal  Concentration:  Concentration: Good and Attention Span: Good  Recall:  Good  Fund of Knowledge: Good  Language: Good  Assets:  Desire for Improvement Financial Resources/Insurance Housing Transportation  ADL's:  Intact  Cognition: WNL  Prognosis:  Good   DIAGNOSES:    ICD-10-CM   1. Recurrent major depression in full remission (HCC)  F33.42     2. Generalized anxiety disorder - stable  F41.1       Receiving Psychotherapy: No   RECOMMENDATIONS:  PDMP was reviewed.  Last Xanax  filled 05/16/2023. I provided 20 minutes of non-face-to-face time during this encounter, including time spent before and after the visit in records review, medical decision making, counseling pertinent to today's visit, and charting.   She's doing well on current meds so no changes will be made.   Continue Xanax  0.5 mg, 1/2-1 p.o. 3 times daily as needed. Continue Zoloft  100 mg, 1 p.o. daily. Return in 3 months.  Marvia Slocumb, PA-C

## 2024-04-10 ENCOUNTER — Telehealth (INDEPENDENT_AMBULATORY_CARE_PROVIDER_SITE_OTHER): Admitting: Physician Assistant

## 2024-04-10 ENCOUNTER — Encounter: Payer: Self-pay | Admitting: Physician Assistant

## 2024-04-10 DIAGNOSIS — F411 Generalized anxiety disorder: Secondary | ICD-10-CM

## 2024-04-10 DIAGNOSIS — Z560 Unemployment, unspecified: Secondary | ICD-10-CM

## 2024-04-10 DIAGNOSIS — F3342 Major depressive disorder, recurrent, in full remission: Secondary | ICD-10-CM

## 2024-04-10 MED ORDER — ALPRAZOLAM 0.5 MG PO TABS: 0.2500 mg | ORAL_TABLET | Freq: Three times a day (TID) | ORAL | 1 refills | Status: AC | PRN

## 2024-04-10 MED ORDER — SERTRALINE HCL 100 MG PO TABS: 100.0000 mg | ORAL_TABLET | Freq: Every day | ORAL | 1 refills | Status: AC

## 2024-04-10 NOTE — Progress Notes (Unsigned)
 [facility]  Patient ID: Pam Graham,  MRN: [id number]  PCP: Pcp, No  Date of Evaluation: 04/10/2024 Time spent:25 minutes  Chief Complaint:  Chief Complaint   Anxiety; Depression; Follow-up    Virtual Visit via Telehealth  I connected with patient by a video enabled telemedicine application with their informed consent, and verified patient privacy and that I am speaking with the correct person using two identifiers.  I am private, in my office and the patient is at home.  I discussed the limitations, risks, security and privacy concerns of performing an evaluation and management service by video and the availability of in person appointments. I also discussed with the patient that there may be a patient responsible charge related to this service. The patient expressed understanding and agreed to proceed.   I discussed the assessment and treatment plan with the patient. The patient was provided an opportunity to ask questions and all were answered. The patient agreed with the plan and demonstrated an understanding of the instructions.   The patient was advised to call back or seek an in-person evaluation if the symptoms worsen or if the condition fails to improve as anticipated.  I provided  25 minutes of non-face-to-face time during this encounter.  HISTORY/CURRENT STATUS: HPI For routine med check.  Lost her job and insurance same day last week, blessing in disguise, was nursing Sports coach, didn't like the job anyway. Had lots of emotions when she was told they were letting her go. But happy now.  She'll be ok financially for a little while as she decides what she wants to do.  Started HRT since LOV. Unsure its effects for now.  Her energy and motivation are good.   No extreme sadness, tearfulness, or feelings of hopelessness.  Sleeps ok.  ADLs and personal hygiene are normal.   Denies any changes in concentration, making decisions, or remembering things.   Appetite has not changed.  Weight is stable.  No mania, delirium, AH/VH.  No SI/HI.  Individual Medical History/ Review of Systems: Changes? :Yes  see HPI    Past medications for mental health diagnoses include: Prozac , trazodone , Xanax   Allergies: Iohexol, Penicillins, and Clindamycin  Current Medications:  Current Outpatient Medications:    cetirizine (ZYRTEC) 10 MG tablet, Take 10 mg by mouth daily., Disp: , Rfl:    clobetasol (TEMOVATE) 0.05 % external solution, Apply 1 application  topically 2 (two) times daily as needed (psoriasis)., Disp: , Rfl:    ibuprofen  (ADVIL ) 200 MG tablet, Take 400 mg by mouth every 6 (six) hours as needed for headache., Disp: , Rfl:    LYLLANA  0.1 MG/24HR patch, APPLY 1 PATCH TOPICALLY TO THE SKIN 2 TIMES A WEEK, Disp: , Rfl:    progesterone (PROMETRIUM) 100 MG capsule, TAKE 1 CAPSULE BY MOUTH EVERY DAY AT BEDTIME, Disp: , Rfl:    tirzepatide (ZEPBOUND) 15 MG/0.5ML Pen, Inject 15 mg into the skin., Disp: , Rfl:    triamcinolone cream (KENALOG) 0.1 %, Apply 1 application  topically 2 (two) times daily as needed (psoriasis)., Disp: , Rfl:    ALPRAZolam  (XANAX ) 0.5 MG tablet, Take 0.5-1 tablets (0.25-0.5 mg total) by mouth 3 (three) times daily as needed for anxiety., Disp: 30 tablet, Rfl: 1   LO LOESTRIN  FE 1 MG-10 MCG / 10 MCG tablet, Take 1 tablet by mouth daily., Disp: , Rfl:    MOUNJARO 2.5 MG/0.5ML Pen, , Disp: , Rfl:    sertraline  (ZOLOFT ) 100 MG tablet, Take 1  tablet (100 mg total) by mouth daily., Disp: 90 tablet, Rfl: 1 Medication Side Effects: none  Family Medical/ Social History: Changes?  See HPI  MENTAL HEALTH EXAM:  There were no vitals taken for this visit.There is no height or weight on file to calculate BMI.  General Appearance: Casual and Well Groomed  Eye Contact:  Good  Speech:  Clear and Coherent and Normal Rate  Volume:  Normal  Mood:  Euthymic  Affect:  Congruent  Thought Process:  Goal Directed and Descriptions of Associations:  Circumstantial  Orientation:  Full (Time, Place, and Person)  Thought Content: Logical   Suicidal Thoughts:  No  Homicidal Thoughts:  No  Memory:  WNL  Judgement:  Good  Insight:  Good  Psychomotor Activity:  Normal  Concentration:  Concentration: Good and Attention Span: Good  Recall:  Good  Fund of Knowledge: Good  Language: Good  Assets:  Communication Skills Desire for Improvement Financial Resources/Insurance Housing Resilience Transportation  ADL's:  Intact  Cognition: WNL  Prognosis:  Good   DIAGNOSES:    ICD-10-CM   1. Recurrent major depression in full remission  F33.42     2. Generalized anxiety disorder - stable  F41.1     3. Loss of job  Z56.0       Receiving Psychotherapy: No   RECOMMENDATIONS:  PDMP was reviewed.  Last Xanax  filled 05/16/2023. I provided approximately 25 minutes of face to face time during this encounter, including time spent before and after the visit in records review, medical decision making, counseling pertinent to today's visit, and charting.   I'm sorry to hear about her job loss.    She's doing well on current meds so no changes are needed.   Continue Xanax  0.5 mg, 1/2-1 p.o. 3 times daily as needed. Continue Zoloft  100 mg, 1 p.o. daily. Return in 6 months.   Verneita Cooks, PA-C

## 2024-10-09 ENCOUNTER — Ambulatory Visit: Payer: Self-pay | Admitting: Physician Assistant
